# Patient Record
Sex: Male | Born: 1935 | Race: White | Hispanic: No | Marital: Married | Smoking: Never smoker
Health system: Southern US, Community
[De-identification: ages and names within clinical notes are randomized; demographics above are authoritative.]

## PROBLEM LIST (undated history)

## (undated) DIAGNOSIS — K219 Gastro-esophageal reflux disease without esophagitis: Secondary | ICD-10-CM

## (undated) DIAGNOSIS — I10 Essential (primary) hypertension: Secondary | ICD-10-CM

## (undated) DIAGNOSIS — N189 Chronic kidney disease, unspecified: Secondary | ICD-10-CM

## (undated) HISTORY — PX: COLONOSCOPY: SHX174

## (undated) HISTORY — PX: APPENDECTOMY: SHX54

## (undated) HISTORY — PX: COLON SURGERY: SHX602

## (undated) HISTORY — PX: KIDNEY TRANSPLANT: SHX239

## (undated) HISTORY — PX: TONSILLECTOMY: SUR1361

## (undated) HISTORY — PX: OTHER SURGICAL HISTORY: SHX169

---

## 2006-07-31 ENCOUNTER — Ambulatory Visit (HOSPITAL_COMMUNITY): Admission: RE | Admit: 2006-07-31 | Discharge: 2006-07-31 | Payer: Self-pay | Admitting: Nephrology

## 2006-08-01 ENCOUNTER — Ambulatory Visit (HOSPITAL_COMMUNITY): Admission: RE | Admit: 2006-08-01 | Discharge: 2006-08-01 | Payer: Self-pay | Admitting: Diagnostic Radiology

## 2006-09-04 ENCOUNTER — Ambulatory Visit: Payer: Self-pay | Admitting: Vascular Surgery

## 2006-09-14 ENCOUNTER — Ambulatory Visit: Payer: Self-pay | Admitting: Vascular Surgery

## 2006-09-14 ENCOUNTER — Ambulatory Visit (HOSPITAL_COMMUNITY): Admission: RE | Admit: 2006-09-14 | Discharge: 2006-09-14 | Payer: Self-pay | Admitting: Vascular Surgery

## 2006-09-30 ENCOUNTER — Ambulatory Visit: Payer: Self-pay | Admitting: Vascular Surgery

## 2006-11-23 ENCOUNTER — Ambulatory Visit (HOSPITAL_COMMUNITY): Admission: RE | Admit: 2006-11-23 | Discharge: 2006-11-23 | Payer: Self-pay | Admitting: Nephrology

## 2007-03-05 ENCOUNTER — Ambulatory Visit (HOSPITAL_COMMUNITY): Admission: RE | Admit: 2007-03-05 | Discharge: 2007-03-05 | Payer: Self-pay | Admitting: Nephrology

## 2007-05-07 ENCOUNTER — Ambulatory Visit (HOSPITAL_COMMUNITY): Admission: RE | Admit: 2007-05-07 | Discharge: 2007-05-07 | Payer: Self-pay | Admitting: Nephrology

## 2008-05-17 IMAGING — XA IR AV DIALYSIS ACCESS DECLOT
1 series · 12 of 24 positions shown · non-contrast
Comparison: none

08/06/06 – CORRECTED EXAM ORDERS:  The exams on accession numbers [id number] and [id number] were ordered in error, and a credit has been applied to the patient’s account for these exams.   A new charge has been issued and associated with this report for SP US GUIDE VASC ACCESS, accession # [id number].  No change has been made to the original report.
CLINICAL DATA: End stage renal failure with clotted left upper extremity fistula.
 LEFT UPPER EXTREMITY FISTULA DECLOT:
 LEFT UPPER EXTREMITY PTA:
 ULTRASOUND GUIDANCE FOR VASCULAR ACCESS:
 Medications:  Heparin 6333 units, TPA 4 mg.  The patient was continuously monitored by the radiology nurse, who administered IV Versed and fentanyl for moderate sedation. 
 Procedure:  Informed consent was obtained.  Ultrasound demonstrated large central thrombus throughout the entire extent of the left cephalic vein.  The AV fistula was patent, but the clot started just beyond the anastomosis.  The left upper extremity was prepped and draped in a sterile fashion.  The clotted vein was accessed using ultrasound guidance and a micropuncture dilator set was placed.  A 6-French vascular sheath was placed, and a wire and catheter were advanced into the central cephalic vein and eventually into the left subclavian vein.  The left subclavian vein and innominate vein were found to be patent.  An infusion catheter was placed along the length of the cephalic vein, and 4 mg of TPA was administered with pulse-spray technique.  Approximately 15 minutes after administration of the TPA, the entire cephalic vein was treated with the AngioJet thrombectomy device.  Ultrasound suggested there was very little change in the amount of thrombus.  The catheter was readvanced to the central cephalic vein, and a venogram demonstrated a critical stenosis at the junction of the cephalic vein and left subclavian vein.  This area was angioplastied multiple times with a 7-mm balloon demonstrating improved flow but a persistently small lumen.  The cephalic vein was retreated with the Angioject balloon, and the entire length of the cephalic vein was angioplastied with a 7-mm balloon.  A second access was obtained using ultrasound guidance and directed towards the AV fistula.  The clot near the fistula was treated with an over-the-wire Fogarty balloon.  Following the balloon thrombectomy, there was improved flow in the distal aspect of the cephalic vein, but there continued to be poor outflow.  This area was retreated with an angioplasty balloon but again, there was no significant improvement in the more central flow.  No further intervention was felt to be of benefit, and the sheaths were removed with manual compression.  Initially, there was a strong pulse the cephalic vein following removal of the sheath, but this diminished over time.

[Series 1: run · 12 of 29 slices shown]
[im 2/29]
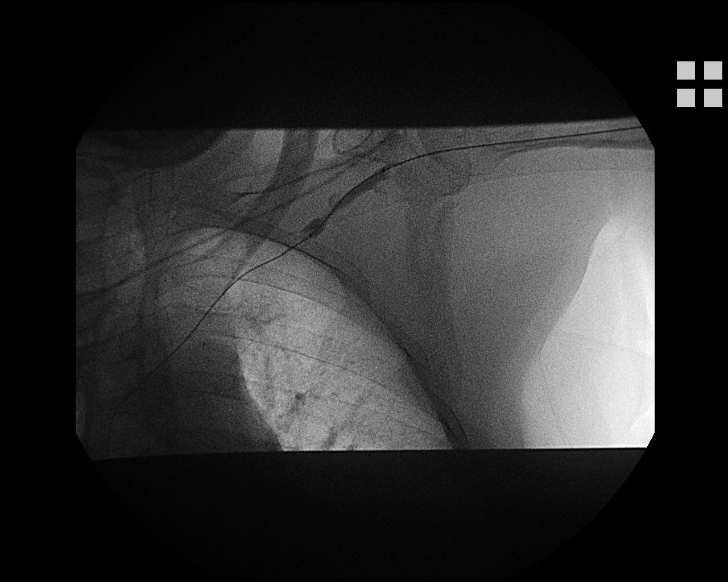
[im 4/29]
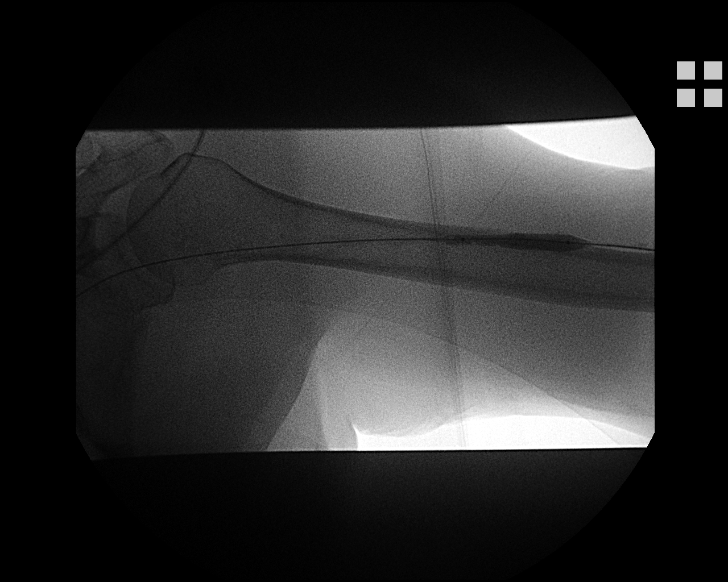
[im 7/29]
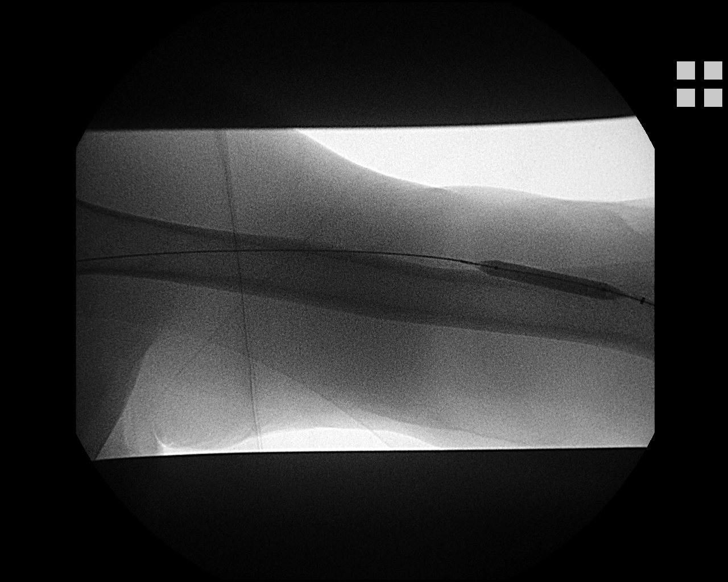
[im 9/29]
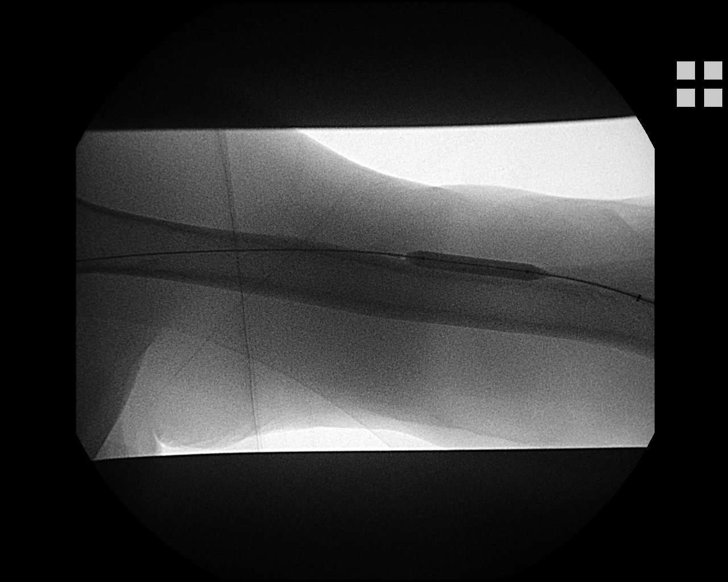
[im 11/29]
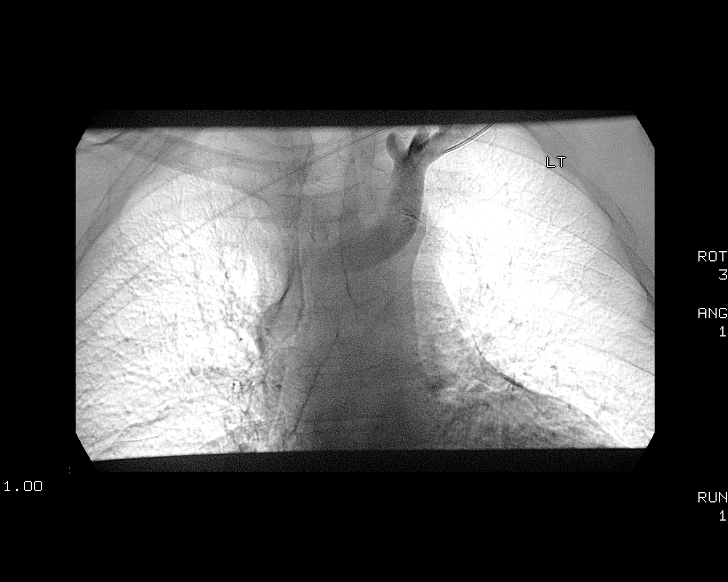
[im 14/29]
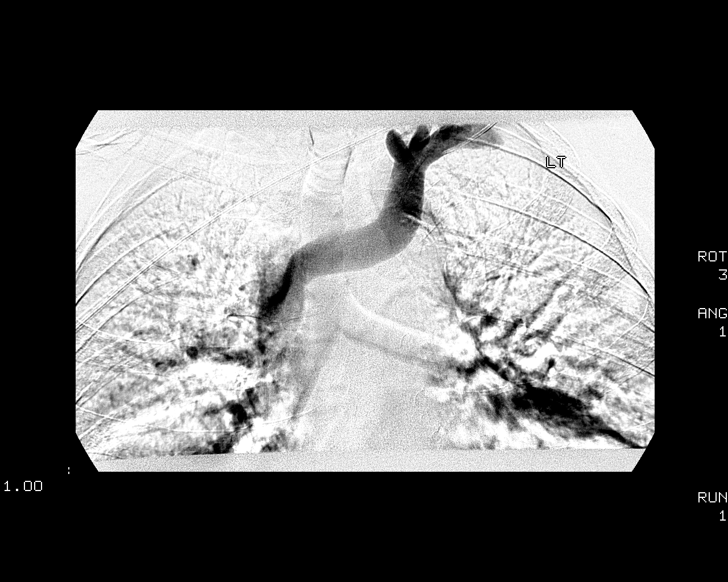
[im 16/29]
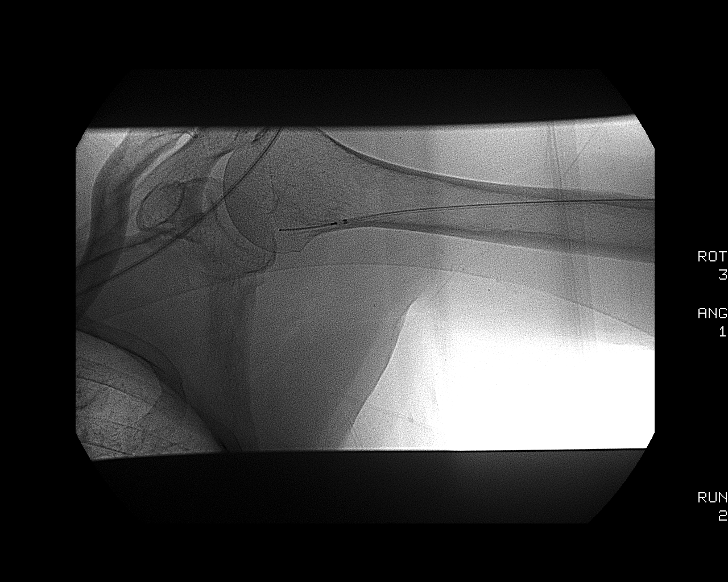
[im 19/29]
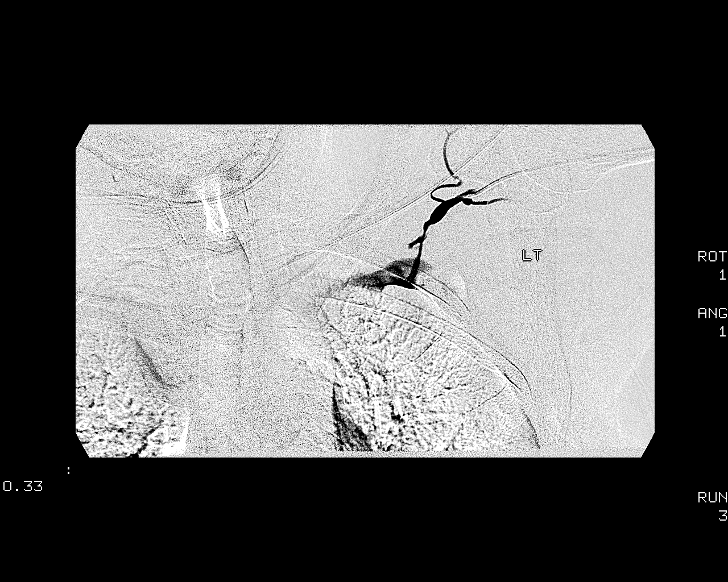
[im 21/29]
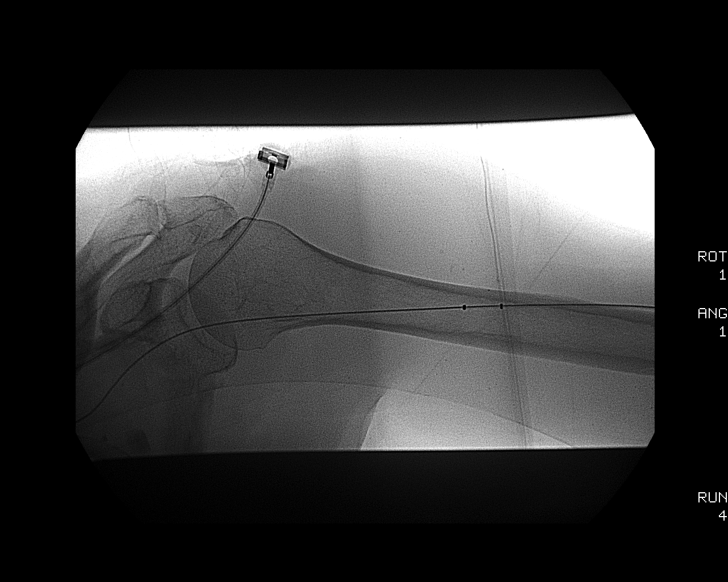
[im 24/29]
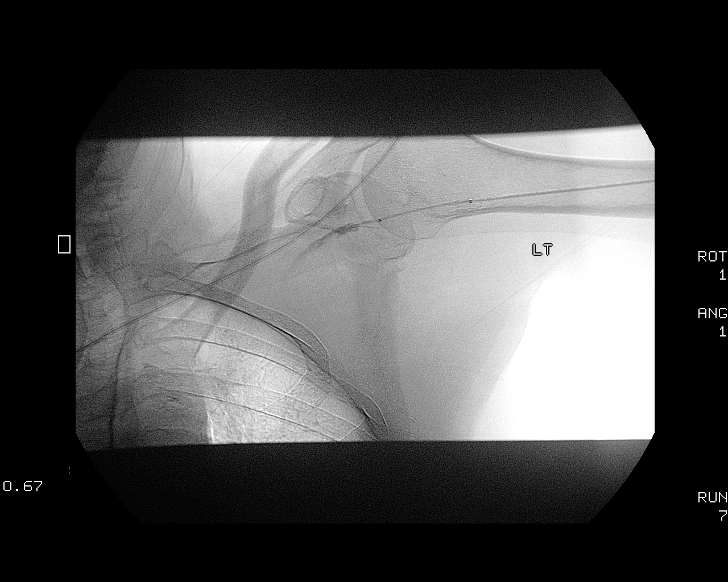
[im 26/29]
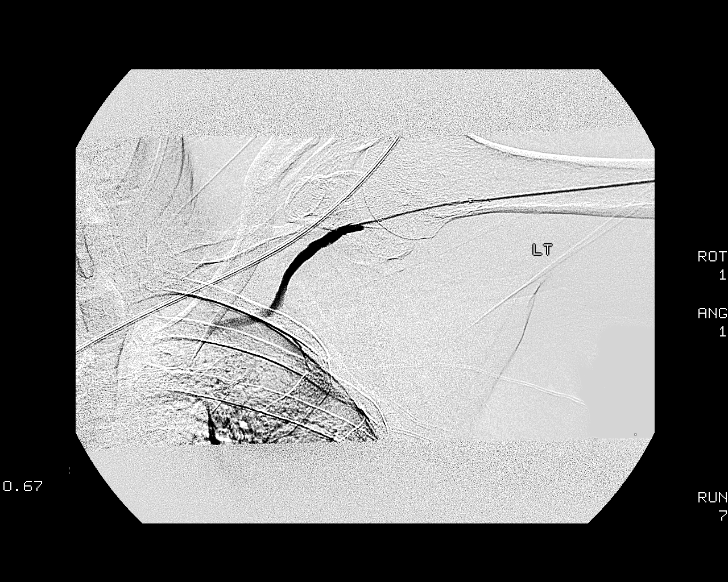
[im 29/29]
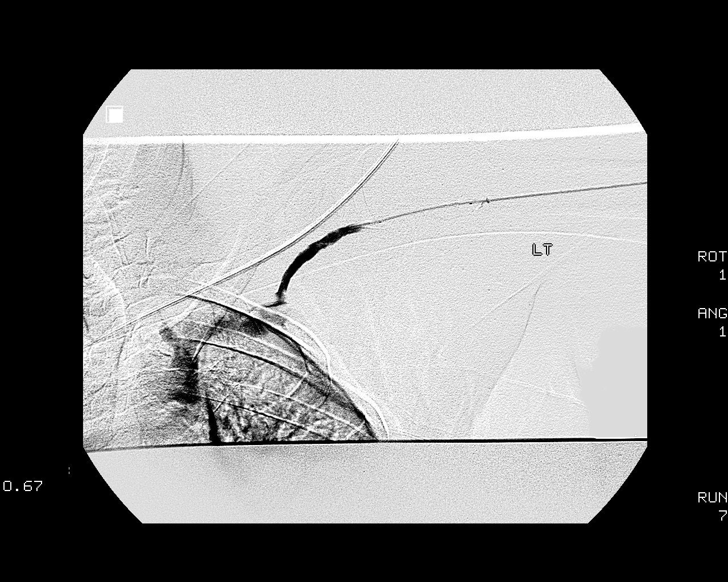

[12 of 24 positions shown; findings below may reference images not displayed]

IMPRESSION: Thrombosis of the left cephalic vein with a critical stenosis involving the central cephalic vein.  Unable to successfully declot the left cephalic vein.  These findings were discussed with Dr. Trimmel, and the patient will be set up for a tunneled dialysis catheter in the morning prior to hemodialysis.

## 2008-07-01 IMAGING — CR DG CHEST 1V
1 series · 1 of 1 positions shown · non-contrast
Comparison: Earlier study.

CLINICAL DATA: End-stage renal disease. Question nipple shadow versus nodule.
 PA CHEST WITH NIPPLE MARKERS ? 1 VIEW:

[view not recorded]
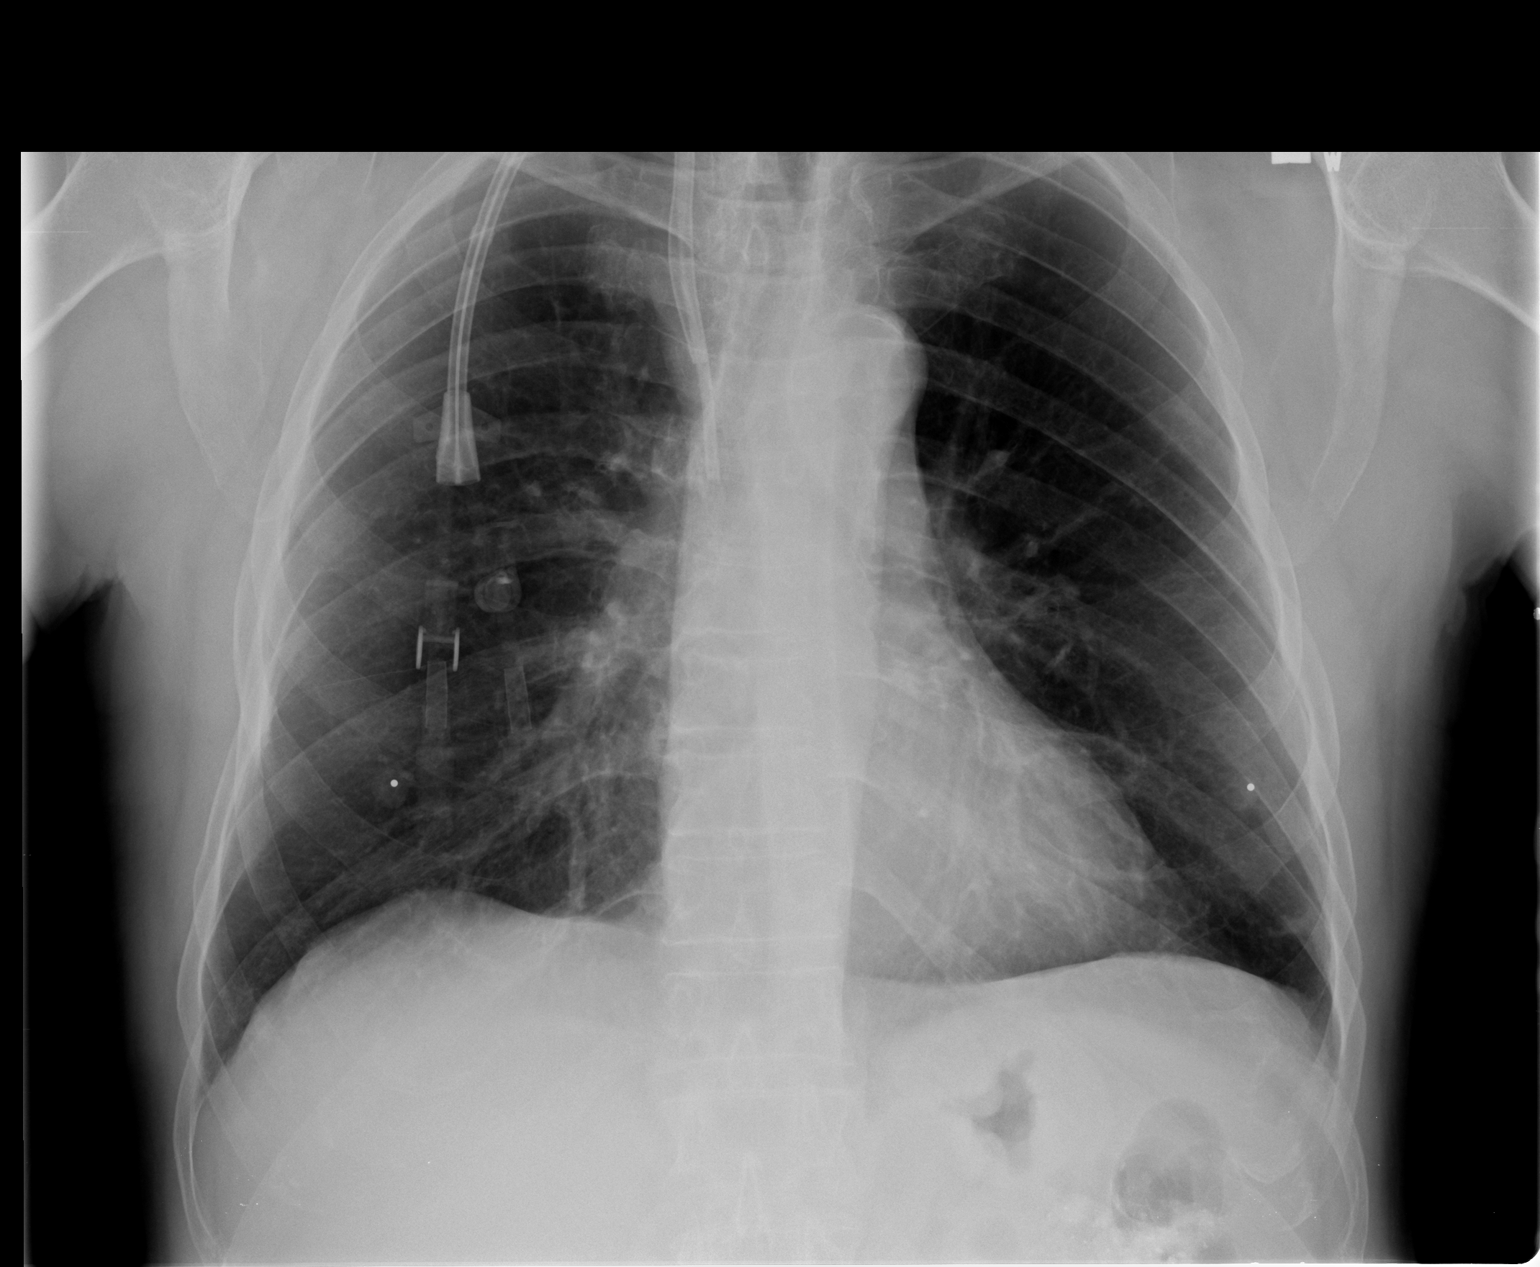

[1 of 1 positions shown; findings below may reference images not displayed]

FINDINGS: The questioned nodular densities associated with the lung bases do represent the patient?s nipples. There is no evidence for a parenchymal pulmonary nodule.
IMPRESSION: Nodular density noted on the recent chest film corresponds to the patient?s nipples.

## 2008-07-01 IMAGING — CR DG CHEST 2V
2 series · 2 of 2 positions shown · non-contrast
Comparison: No priors.

CLINICAL DATA: Endstage renal disease.  Preoperative chest.
 CHEST ? 2 VIEW:

[view not recorded (1 of 2)]
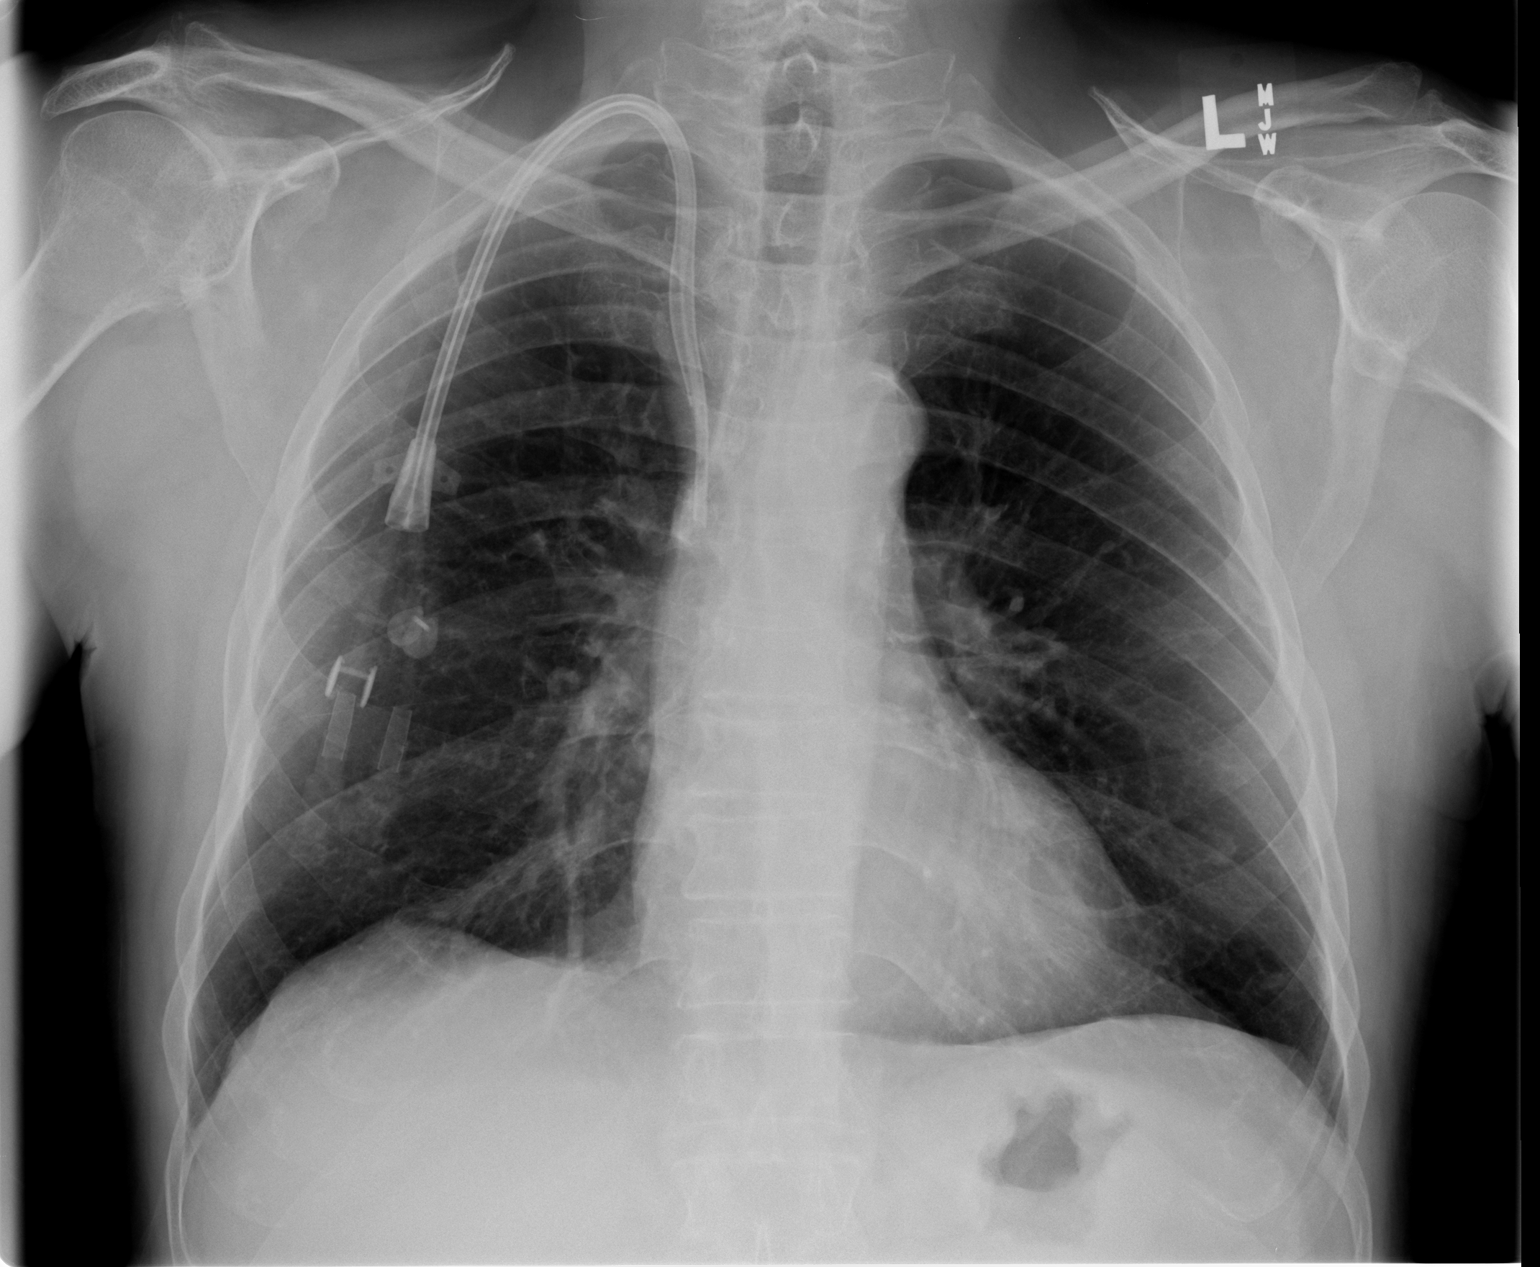

[view not recorded (2 of 2)]
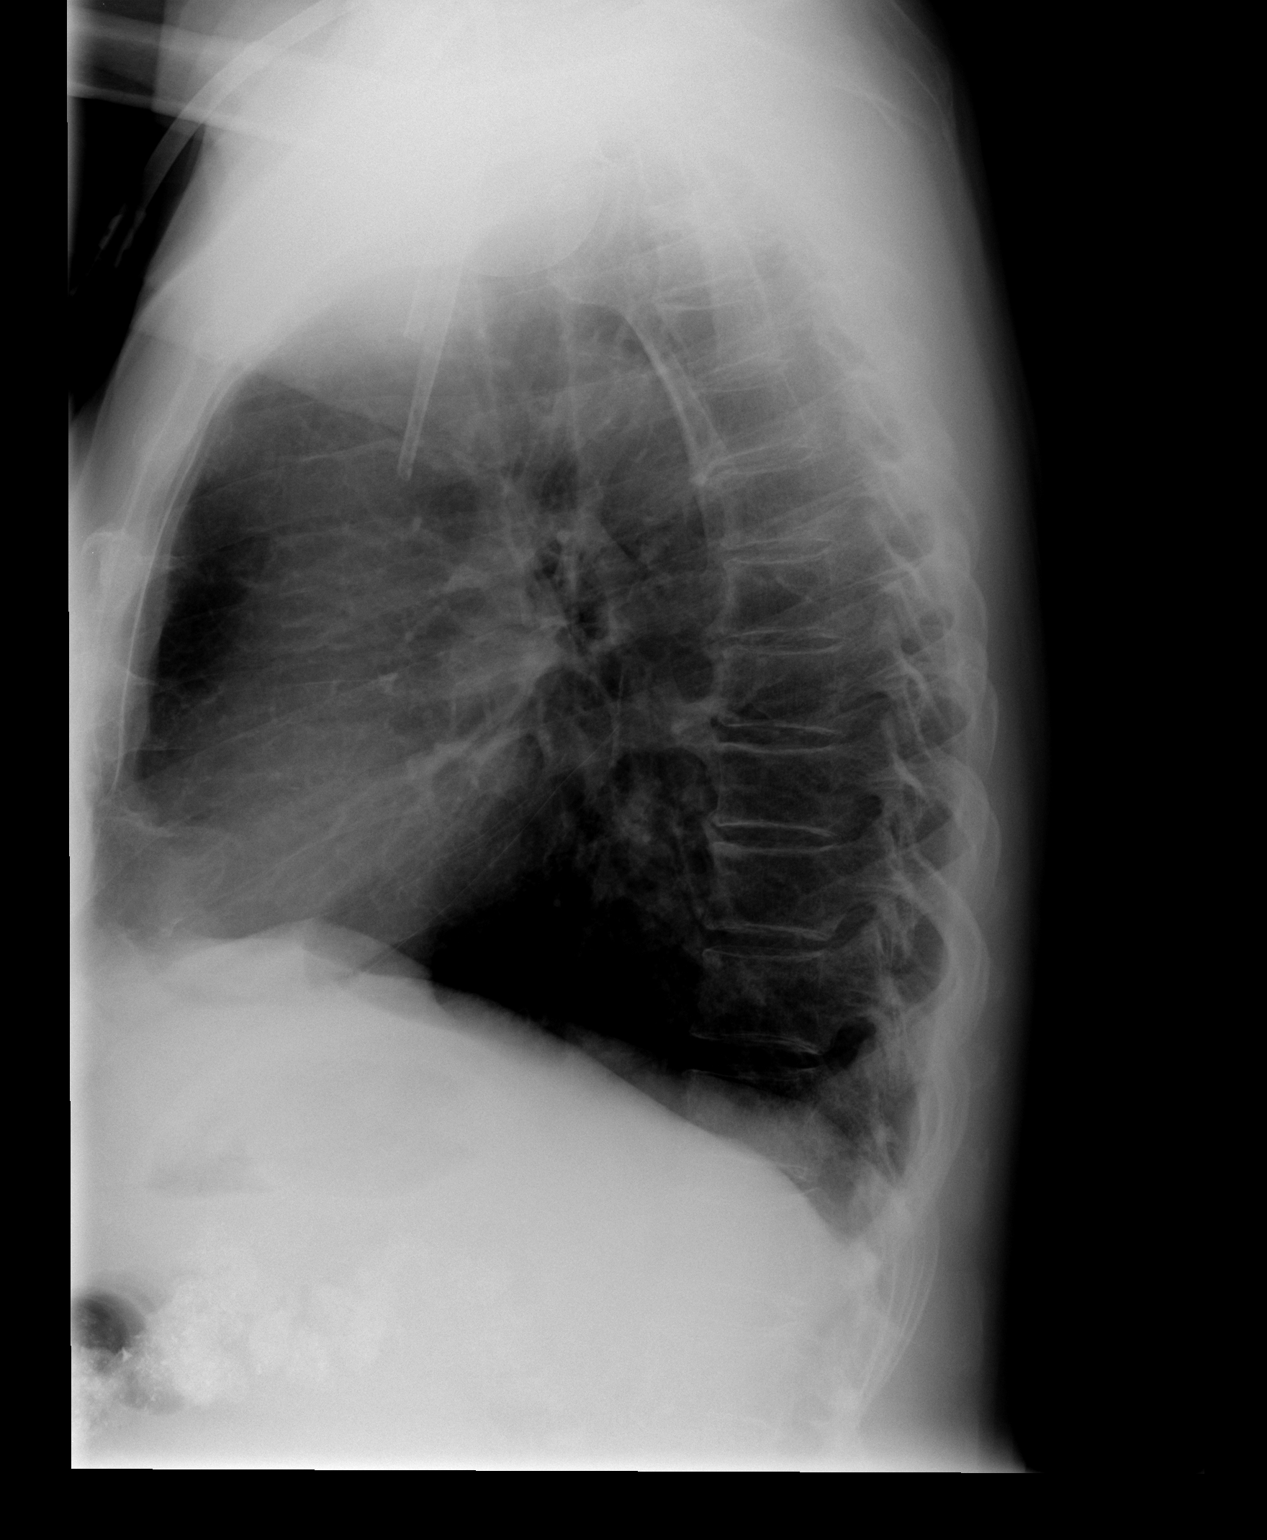

[2 of 2 positions shown; findings below may reference images not displayed]

FINDINGS: There is a nodular density associated with the left lung base, which may represent a pulmonary nodule or nipple shadow, and I recommend PA chest with nipple markers.  There is a hemodialysis central venous catheter seen via the right internal jugular venous route with the tips of the catheter within the superior vena cava.  There are no infiltrative or edematous changes.  The heart and mediastinal structures are normal.
IMPRESSION: 1. Left basilar nodule versus nipple shadow.  Recommend PA chest with nipple markers.  
 2. No acute infiltrative or edematous changes.

## 2008-08-30 ENCOUNTER — Ambulatory Visit (HOSPITAL_COMMUNITY): Admission: RE | Admit: 2008-08-30 | Discharge: 2008-08-30 | Payer: Self-pay | Admitting: Vascular Surgery

## 2008-09-04 ENCOUNTER — Ambulatory Visit: Payer: Self-pay | Admitting: Surgery

## 2008-09-08 ENCOUNTER — Ambulatory Visit (HOSPITAL_COMMUNITY): Admission: RE | Admit: 2008-09-08 | Discharge: 2008-09-08 | Payer: Self-pay | Admitting: Surgery

## 2008-09-08 ENCOUNTER — Ambulatory Visit: Payer: Self-pay | Admitting: Vascular Surgery

## 2008-09-22 ENCOUNTER — Ambulatory Visit (HOSPITAL_COMMUNITY): Admission: RE | Admit: 2008-09-22 | Discharge: 2008-09-22 | Payer: Self-pay | Admitting: Vascular Surgery

## 2008-12-11 ENCOUNTER — Ambulatory Visit: Payer: Self-pay | Admitting: Surgery

## 2010-05-11 LAB — POCT I-STAT, CHEM 8
Chloride: 106 mEq/L (ref 96–112)
HCT: 33 % — ABNORMAL LOW (ref 39.0–52.0)
Hemoglobin: 11.2 g/dL — ABNORMAL LOW (ref 13.0–17.0)

## 2010-05-11 LAB — POCT I-STAT 4, (NA,K, GLUC, HGB,HCT)
Glucose, Bld: 101 mg/dL — ABNORMAL HIGH (ref 70–99)
Hemoglobin: 12.2 g/dL — ABNORMAL LOW (ref 13.0–17.0)
Potassium: 4.6 mEq/L (ref 3.5–5.1)
Sodium: 138 mEq/L (ref 135–145)

## 2010-06-18 NOTE — Op Note (Signed)
Ricky Wallace, Ricky Wallace              ACCOUNT NO.:  [id number]   MEDICAL RECORD NO.:  [id number]          PATIENT TYPE:  AMB   LOCATION:  SDS                          FACILITY:  [facility]   PHYSICIAN:  Quita Skye. Hart Rochester, M.D.  DATE OF BIRTH:  [birth year]   DATE OF PROCEDURE:  09/22/2008  DATE OF DISCHARGE:  09/22/2008                               OPERATIVE REPORT   PREOPERATIVE DIAGNOSIS:  End-stage renal disease.   POSTOPERATIVE DIAGNOSIS:  End-stage renal disease.   OPERATION:  Insertion of left upper arm brachial artery to axillary vein  arteriovenous Gore-Tex graft (6-mm).   SURGEON:  Quita Skye. Hart Rochester, MD   FIRST ASSISTANT:  Nurse.   ANESTHESIA:  Local.   PROCEDURE:  The patient was taken to the operating room, placed in the  supine position, at which time, left upper extremity was prepped with  Betadine scrub and solution and draped in a routine sterile manner.  After infiltration of 1% Xylocaine with epinephrine, a short  longitudinal incision was made in the distal upper arm overlying the  brachial artery.  There had been a previous fistula, which had failed  and the anastomosis of cephalic vein and brachial artery dissected free  proximally and distally.  Artery had an excellent pulse.  A second  incision was made just distal to the axilla.  Axillary vein was  dissected free at its junction with the brachial vein, it was a 5-mm  vein of good quality.  A curvilinear tunnel was created on the anterior  aspect of the arm after infiltration of 1% Xylocaine and a 6-mm x 20-cm  Gore-Tex graft was delivered through the tunnel.  No heparin was given.  Brachial arteries were occluded proximally and distally with vessel  loops with a 15-blade where the previous cephalic vein anastomosis had  been performed, this was completely excised.  There was excellent flow  at this site.  The graft was anastomosed end-to-side with 6-0 Prolene  and then anastomosed end-to-end to axillary vein with 6-0  Prolene after  transecting the vein and ligating it distally.  Clamps were then  released and there was an excellent pulse and thrill in the graft and  good Doppler flow on the radial artery distally, although it did augment  with occlusion of the graft.  Wounds were closed in layers with Vicryl  in subcuticular fashion.  Sterile dressing was applied.  The patient was  taken to recovery room in satisfactory condition.      Quita Skye Hart Rochester, M.D.  Electronically Signed     JDL/MEDQ  D:  09/22/2008  T:  09/23/2008  Job:  [id number]

## 2010-06-18 NOTE — Op Note (Signed)
Ricky Wallace, Ricky Wallace              ACCOUNT NO.:  [id number]   MEDICAL RECORD NO.:  [id number]          PATIENT TYPE:  AMB   LOCATION:  SDS                          FACILITY:  [facility]   PHYSICIAN:  Janetta Hora. Fields, MD  DATE OF BIRTH:  [birth year]   DATE OF PROCEDURE:  09/14/2006  DATE OF DISCHARGE:  09/14/2006                               OPERATIVE REPORT   PROCEDURE:  Right brachiocephalic AV fistula.   PREOPERATIVE DIAGNOSIS:  End-stage renal disease.   POSTOPERATIVE DIAGNOSIS:  End-stage renal disease.   ANESTHESIA:  Local with IV sedation.   ASSISTANT:  Emilio Aspen, RNFA.   FINDINGS:  A 3 mm cephalic vein.   OPERATIVE DETAILS:  After obtaining informed consent, the patient taken  to the operating room.  The patient placed supine position on the  operating room table.  After adequate sedation, the patient's entire  right upper extremity was  prepped and draped in the usual sterile  fashion.  Local anesthesia was then infiltrated near the antecubital  crease.  A transverse incision was made in this location, carried down  through the subcutaneous tissues down to the level of the cephalic vein.  Cephalic vein was dissected free circumferentially.  Small side branches  were ligated and divided between silk ties.  The median antecubital vein  joining the basilic to cephalic system was divided.  Brachial artery was  then dissected free in the medial portion of the incision.  Small side  branches were controlled with Vesseloops.  The patient was given 5000  units of intravenous heparin.  Brachial artery was controlled proximally  and distally with Vesseloops.  Distal cephalic vein was divided and  ligated with a 3-0 silk tie.  Vein was then swung over to the level of  the artery so that the basilic vein system was posterior to the fistula.  Longitudinal arteriotomy was made in the brachial artery and the vein  was sewn end of vein to side of artery using a running 7-0 Prolene  suture.  Just prior to completion of the anastomosis, this was forebled,  backbled and thoroughly flushed.  Anastomosis was secured.  Vesseloops  were released.  There was palpable thrill in the fistula immediately.  Hemostasis was obtained.  Subcutaneous tissues were reapproximated using  running 3-0 Vicryl suture.  Skin was closed with a 4-0 Vicryl  subcuticular stitch.  The patient tolerate procedure well and there were  no complications.  Instrument, sponge and needle count was correct at  the end of the case.  The patient was taken to the recovery room in  stable condition.      Janetta Hora. Fields, MD  Electronically Signed     CEF/MEDQ  D:  09/14/2006  T:  09/15/2006  Job:  [id number]

## 2010-06-18 NOTE — Assessment & Plan Note (Signed)
OFFICE VISIT   GRAYLING, SCHRANZ  DOB:  Oct 04, 1922                                       09/04/2006  AVWUJ#:[id number]   NOTE FOR THE CHART:  The patient is an 77 year old male with end-stage renal disease.  He  currently dialyzes on Tuesday, Thursday and Saturday.  He was started on  hemodialysis in January of this year.  He previously had a left  brachiocephalic AV fistula placed but this has now failed.  He presents  today for consideration for placement of a new access.   PAST MEDICAL HISTORY:  Is remarkable for hypertension, otherwise it is  fairly unremarkable.   MEDICATIONS:  Colchicine 0.6 mg once a day p.r.n.  Fosrenol 1500 mg with meals, 500 mg with snacks.  Kayexalate 30 mg p.r.n.  Nephrovite 1 everyday.  Protonix 40 mg once a day.  Folic acid 1 mg daily.   PAST SURGICAL HISTORY:  Remarkable for his fistula placement, otherwise  no other significant operations.   SOCIAL HISTORY:  He is married and has 2 children.  He is a nonsmoker  and non-consumer of alcohol.   REVIEW OF SYSTEMS:  NEUROLOGICAL:  Has some occasional dizziness when  standing suddenly.  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  Otherwise it is fairly unremarkable.   ALLERGIES:  No known drug allergies.   PHYSICAL EXAMINATION:  Vital signs:  Blood pressure 151/88 in the right  arm, heart rate is 82 and regular.  He has 2+ brachial and radial pulses  bilaterally.  He has a palpable cord and grossly occluded left  brachiocephalic AV fistula.   On placement of the tourniquet on the right upper arm, he has an easily  palpable right upper arm cephalic vein.  The cephalic vein is small at  the wrist.  Overall I believe the best option for the this patient for  his next access would be a right brachiocephalic AV fistula.  He is  currently dialyzing by right-sided tunnel catheter.  He will continue to  use this until the fistula is ready for use.  He was  scheduled to have  his fistula placed on 09/14/2006.  Procedure details, risks, benefits,  possible complications were explained to the patient, he agrees to  proceed.   Janetta Hora. Fields, MD  Electronically Signed   CEF/MEDQ  D:  09/06/2006  T:  09/07/2006  Job:  [id number]   cc:   Jorja Loa, M.D.

## 2010-06-18 NOTE — Op Note (Signed)
Ricky Wallace, Ricky Wallace              ACCOUNT NO.:  [id number]   MEDICAL RECORD NO.:  [id number]          PATIENT TYPE:  AMB   LOCATION:  SDS                          FACILITY:  [facility]   PHYSICIAN:  Quita Skye. Hart Rochester, M.D.  DATE OF BIRTH:  17-Oct-1922   DATE OF PROCEDURE:  09/08/2008  DATE OF DISCHARGE:  09/08/2008                               OPERATIVE REPORT   PREOPERATIVE DIAGNOSIS:  End-stage renal disease.   POSTOPERATIVE DIAGNOSIS:  End-stage renal disease.   PROCEDURES:  Bilateral upper extremity venogram and central venograms.   SURGEON:  Quita Skye. Hart Rochester, M.D.   ANESTHESIA:  None.   CONTRAST:  50 mL Visipaque.   DESCRIPTION OF PROCEDURE:  The patient was taken to the peripheral  vascular lab and the patient positioned on the table.  Bilateral upper  extremity IVs had been placed.  Bilateral upper extremity venograms were  performed beginning in the proximal forearm extending up throughout the  upper arm into the chest.  Beginning on the left side this was done  using a 50% contrast saline concentration of Visipaque.  On the left  side the brachial and basilic veins in the antecubital area and  throughout the upper arm were quite small but did converge to form a  normal sized axillary vein which was widely patent in the subclavian and  innominate veins into the chest.  On the right side there were also  small brachial and basilic veins with the axillary vein being adequate  size in the axilla.  However, there was severe stenosis in the stent  which had been placed in the innominate vein in the past with very tight  narrowing approximating 90-95% within the stent.  Having tolerated the  procedure well the patient was taken to short stay and discharged to be  scheduled for an AV graft in the left arm in the near future.      Quita Skye Hart Rochester, M.D.  Electronically Signed     JDL/MEDQ  D:  09/11/2008  T:  09/11/2008  Job:  [id number]

## 2010-06-18 NOTE — Assessment & Plan Note (Signed)
OFFICE VISIT   Ricky Wallace, Ricky Wallace  DOB:  March 27, 1922                                       09/04/2008  ZOXWR#:[id number]   REASON FOR VISIT:  New access.   HISTORY:  This is an 77 year old gentleman with end-stage renal disease  who dialyzes on Tuesday, Thursday, Saturday.  He has had a fistula in  his left upper arm as well as his right upper arm, the one in the right  upper arm has recently thrombosed and could not be opened  percutaneously.  He does have a right innominate vein stent.  He had a  catheter placed at [facility] in his left groin recently, he comes in today  for new access planning.   I would like to proceed with bilateral upper extremity venogram and  central venogram to evaluate for central stenosis.  He is right-handed,  so if his central veins are patent I would like to proceed with a left  forearm versus upper arm AV Gore-Tex graft.  He does report having a dye  allergy, however he appears to have had multiple dye studies since his  allergy which was documented several years ago.  However, I will  premedicate him prior to his procedure.  Again, I would like to get a  bilateral upper extremity and central venogram, will schedule this for  Wednesday and then get him set up for his graft.   Jorge Ny, MD  Electronically Signed   VWB/MEDQ  D:  09/04/2008  T:  09/05/2008  Job:  [id number]   cc:   Jorja Loa, M.D.

## 2010-06-18 NOTE — Assessment & Plan Note (Signed)
OFFICE VISIT   LAITH, ANTONELLI  DOB:  1922-04-06                                       12/11/2008  ZOXWR#:[id number]   REASON FOR VISIT:  Evaluate dialysis graft.   HISTORY:  This is an 77 year old gentleman who had a left upper arm  graft placed several months ago.  He was recently at [facility]  for fevers.  There was concern that the graft may be the source.  However, at that time he was recently postop and had some bruising,  although the graft had been able to be accessed.  He comes in today and  the graft is without any evidence of erythema or infection.  It is  nontender.  There is a good thrill within the graft.  I do not think  there is anything wrong with this graft.  He will follow up with [place] on a  p.r.n. basis.   Jorge Ny, MD  Electronically Signed   VWB/MEDQ  D:  12/11/2008  T:  12/12/2008  Job:  [id number]

## 2010-06-18 NOTE — Assessment & Plan Note (Signed)
OFFICE VISIT   SOLLIE, VULTAGGIO  DOB:  Oct 13, 1922                                       09/30/2006  EAVWU#:[id number]   The patient returns for followup today after placement of a right  brachiocephalic AV fistula on August 11.   PHYSICAL EXAMINATION:  His incision is well healed.  He has an easily  palpable thrill in the fistula.  It is greater than 20 mm in diameter in  the upper arm.  He has no evidence of steal.  The fistula should be  ready to use within 1 month's time.  He will follow up on an as-needed  basis.   Janetta Hora. Fields, MD  Electronically Signed   CEF/MEDQ  D:  09/30/2006  T:  10/01/2006  Job:  [id number]

## 2010-06-18 NOTE — Op Note (Signed)
[name], [name]              ACCOUNT NO.:  [id number]   MEDICAL RECORD NO.:  [id number]          PATIENT TYPE:  AMB   LOCATION:  SDS                          FACILITY:  [facility]   PHYSICIAN:  Quita Skye. Hart Rochester, M.D.  DATE OF BIRTH:  [birth year]   DATE OF PROCEDURE:  DATE OF DISCHARGE:  09/08/2008                               OPERATIVE REPORT   PREOPERATIVE DIAGNOSIS:  End-stage renal disease.   POSTOPERATIVE DIAGNOSIS:  End-stage renal disease.   PROCEDURES:  Bilateral upper extremity venogram and central venograms.   SURGEON:  Quita Skye. Hart Rochester, M.D.   ANESTHESIA:  None.   CONTRAST:  50 mL Visipaque.   DESCRIPTION OF PROCEDURE:  The patient was taken to the peripheral  vascular lab and the patient positioned on the table.  Bilateral upper  extremity IVs had been placed.  Bilateral upper extremity venograms were  performed beginning in the proximal forearm extending up throughout the  upper arm into the chest.  Beginning on the left side this was done  using a 50% contrast saline concentration of Visipaque.  On the left  side the brachial and basilic veins in the antecubital area and  throughout the upper arm were quite small but did converge to form a  normal sized axillary vein which was widely patent in the subclavian and  innominate veins into the chest.  On the right side there were also  small brachial and basilic veins with the axillary vein being adequate  size in the axilla.  However, there was severe stenosis in the stent  which had been placed in the innominate vein in the past with very tight  narrowing approximating 90-95% within the stent.  Having tolerated the  procedure well the patient was taken to short stay and discharged to be  scheduled for an AV graft in the left arm in the near future.      Quita Skye Hart Rochester, M.D.  Electronically Signed     JDL/MEDQ  D:  09/08/2008  T:  09/13/2008  Job:  [id number]

## 2010-07-10 IMAGING — CR DG CHEST 2V
2 series · 2 of 2 positions shown · non-contrast
Comparison: Two-view chest x-ray 09/14/2006.

CLINICAL DATA: End-stage renal disease.  Dialysis access surgery.
Preoperative evaluation.

CHEST - 2 VIEW 09/22/2008:

[view not recorded (1 of 2)]
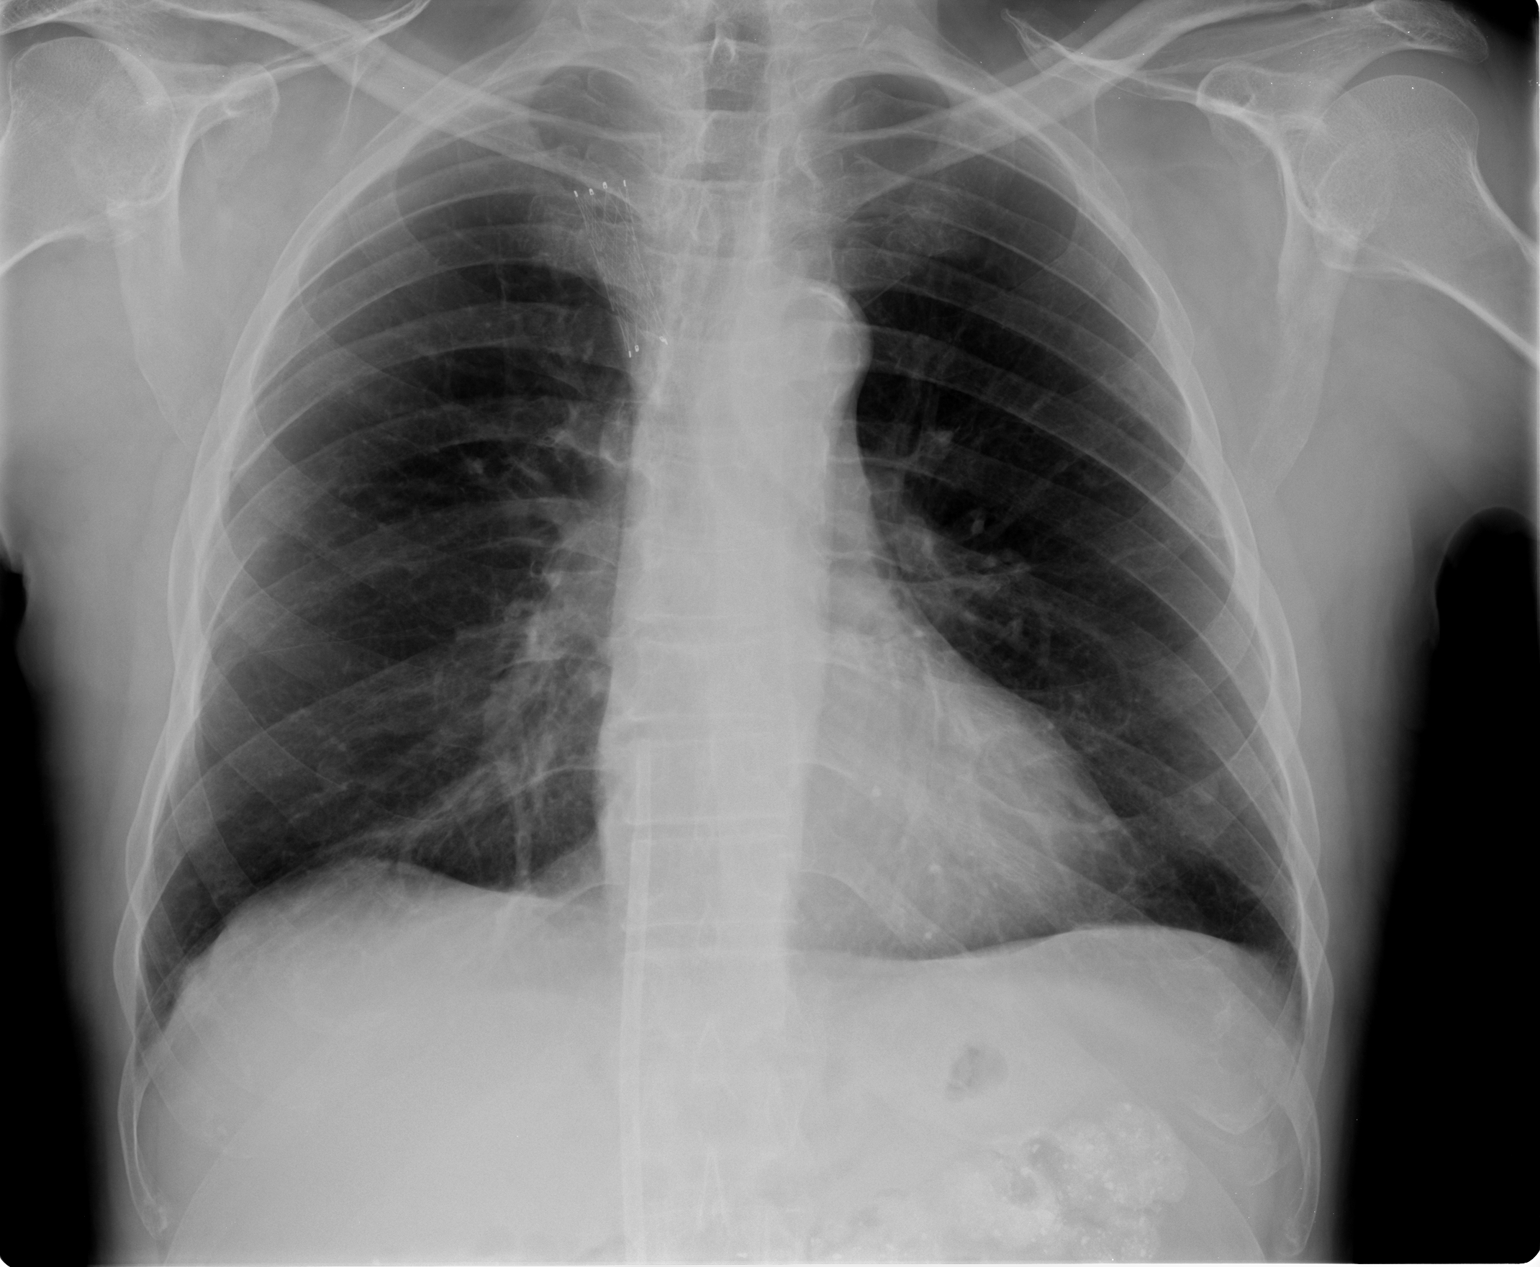

[view not recorded (2 of 2)]
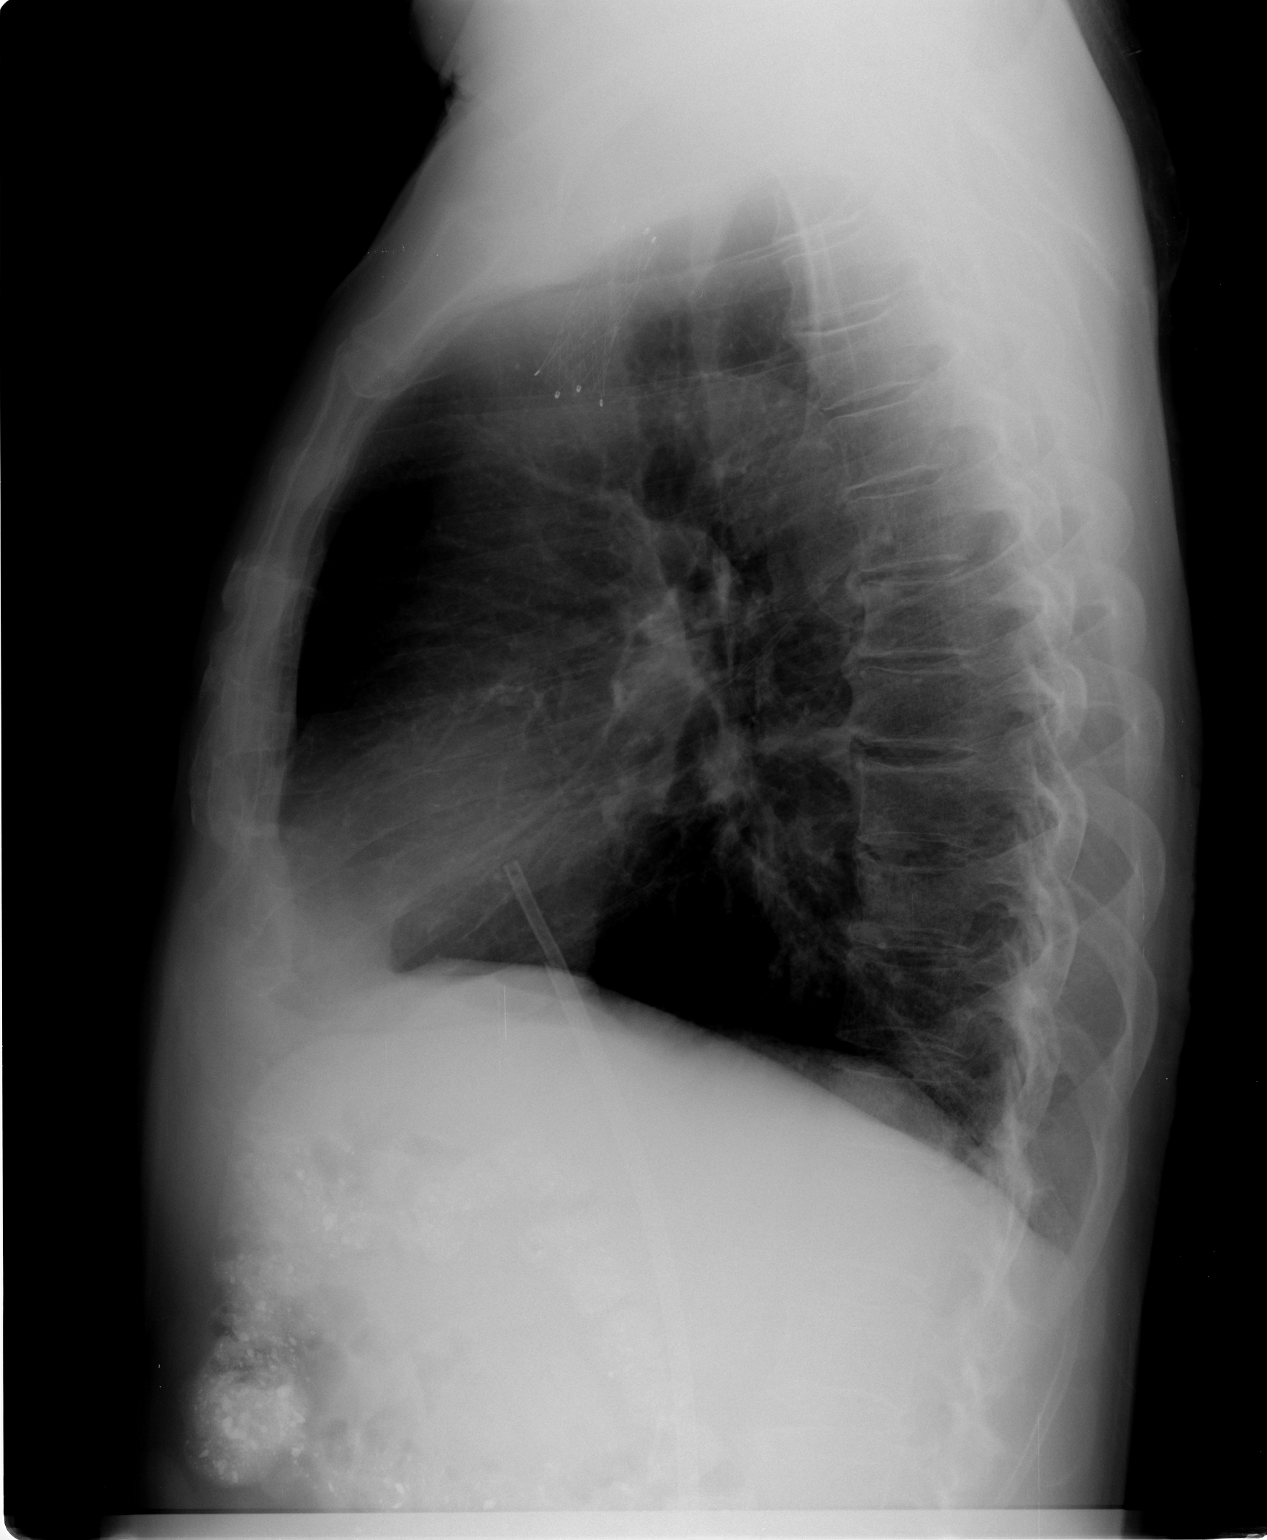

[2 of 2 positions shown; findings below may reference images not displayed]

FINDINGS: Heart size normal and stable.  Thoracic aorta
atherosclerotic, unchanged.  Hilar and mediastinal contours
otherwise unremarkable. Lungs clear.  Pulmonary vascularity normal.
No pleural effusions.  Apparent nodular opacity projected over the
left lung base shown on the prior study to represent the nipple.
Right subclavian vein stent.  Right femoral dialysis catheter tips
in the right atrium.  Degenerative changes in the mid thoracic
spine.
IMPRESSION: No acute cardiopulmonary disease.

## 2010-11-18 LAB — POCT I-STAT 4, (NA,K, GLUC, HGB,HCT)
Glucose, Bld: 97
HCT: 38 — ABNORMAL LOW
Hemoglobin: 12.9 — ABNORMAL LOW
Operator id: 274481
Potassium: 5.1

## 2010-11-20 LAB — POTASSIUM: Potassium: 5.4 — ABNORMAL HIGH

## 2011-04-02 ENCOUNTER — Encounter (INDEPENDENT_AMBULATORY_CARE_PROVIDER_SITE_OTHER): Payer: Self-pay | Admitting: *Deleted

## 2011-05-22 ENCOUNTER — Telehealth (INDEPENDENT_AMBULATORY_CARE_PROVIDER_SITE_OTHER): Payer: Self-pay | Admitting: *Deleted

## 2011-05-22 ENCOUNTER — Encounter (INDEPENDENT_AMBULATORY_CARE_PROVIDER_SITE_OTHER): Payer: Self-pay | Admitting: *Deleted

## 2011-05-22 ENCOUNTER — Other Ambulatory Visit (INDEPENDENT_AMBULATORY_CARE_PROVIDER_SITE_OTHER): Payer: Self-pay | Admitting: *Deleted

## 2011-05-22 DIAGNOSIS — Z85038 Personal history of other malignant neoplasm of large intestine: Secondary | ICD-10-CM

## 2011-05-22 MED ORDER — PEG-KCL-NACL-NASULF-NA ASC-C 100 G PO SOLR: 1.0000 | Freq: Once | ORAL | Status: DC

## 2011-05-22 NOTE — Telephone Encounter (Signed)
Patient needs movi prep 

## 2011-07-01 ENCOUNTER — Telehealth (INDEPENDENT_AMBULATORY_CARE_PROVIDER_SITE_OTHER): Payer: Self-pay | Admitting: *Deleted

## 2011-07-01 NOTE — Telephone Encounter (Signed)
PCP/Requesting MD: Knox Saliva @ [facility]  Name & DOB: Ricky Wallace 07/29/1922     Procedure: tcs  Reason/Indication:  Hx colon ca (1985) Had kidney transplant 2 yrs ago @ [facility]  Has patient had this procedure before?  yes  If so, when, by whom and where?  2 1/2 to 3 yrs ago (Dr Cleotis Nipper)  Is there a family history of colon cancer?  Not sure other than personal history  Who?  What age when diagnosed?    Is patient diabetic?   no      Does patient have prosthetic heart valve?  no  Do you have a pacemaker?  no  Has patient had joint replacement within last 12 months?  no  Is patient on Coumadin, Plavix and/or Aspirin? yes  Medications: asa 81 mg daily, amlodipine 10 mg daily, allegra 180 mg prn, folic acid 1 mg daily, metoprolol 12.5 mg daily, multi vit, myfortic 180 mg bid, omeprazole 20 mg daily, sulfamethoxazole-trimethoprim 400/80 mg 1 tab mon, wed & fri, prograf 1 mg bid, tamsulosin 0.4 mg daily,  Allergies: ivp dye  Medication Adjustment: asa 2 days  Procedure date & time: 07/31/11 at 830

## 2011-07-03 NOTE — Telephone Encounter (Signed)
agree

## 2011-07-22 ENCOUNTER — Encounter (HOSPITAL_COMMUNITY): Payer: Self-pay | Admitting: Pharmacy Technician

## 2011-07-31 ENCOUNTER — Ambulatory Visit (HOSPITAL_COMMUNITY)
Admission: RE | Admit: 2011-07-31 | Discharge: 2011-07-31 | Disposition: A | Discharge status: Home / Self Care | Payer: Medicare Other | Source: Ambulatory Visit | Attending: Internal Medicine | Admitting: Internal Medicine

## 2011-07-31 ENCOUNTER — Encounter (HOSPITAL_COMMUNITY): Payer: Self-pay | Admitting: *Deleted

## 2011-07-31 ENCOUNTER — Encounter (HOSPITAL_COMMUNITY)
Admission: RE | Disposition: A | Discharge status: Home / Self Care | Payer: Self-pay | Source: Ambulatory Visit | Attending: Internal Medicine

## 2011-07-31 DIAGNOSIS — Z85038 Personal history of other malignant neoplasm of large intestine: Secondary | ICD-10-CM | POA: Insufficient documentation

## 2011-07-31 DIAGNOSIS — Z7982 Long term (current) use of aspirin: Secondary | ICD-10-CM | POA: Insufficient documentation

## 2011-07-31 DIAGNOSIS — Z9049 Acquired absence of other specified parts of digestive tract: Secondary | ICD-10-CM | POA: Insufficient documentation

## 2011-07-31 DIAGNOSIS — Z79899 Other long term (current) drug therapy: Secondary | ICD-10-CM | POA: Insufficient documentation

## 2011-07-31 DIAGNOSIS — I10 Essential (primary) hypertension: Secondary | ICD-10-CM | POA: Insufficient documentation

## 2011-07-31 DIAGNOSIS — K5730 Diverticulosis of large intestine without perforation or abscess without bleeding: Secondary | ICD-10-CM | POA: Insufficient documentation

## 2011-07-31 DIAGNOSIS — D126 Benign neoplasm of colon, unspecified: Secondary | ICD-10-CM | POA: Insufficient documentation

## 2011-07-31 HISTORY — DX: Chronic kidney disease, unspecified: N18.9

## 2011-07-31 HISTORY — PX: COLONOSCOPY: SHX5424

## 2011-07-31 HISTORY — DX: Gastro-esophageal reflux disease without esophagitis: K21.9

## 2011-07-31 HISTORY — DX: Essential (primary) hypertension: I10

## 2011-07-31 SURGERY — COLONOSCOPY
Anesthesia: Moderate Sedation

## 2011-07-31 MED ORDER — MIDAZOLAM HCL 5 MG/5ML IJ SOLN
INTRAMUSCULAR | Status: AC
  Filled 2011-07-31: qty 10

## 2011-07-31 MED ORDER — SODIUM CHLORIDE 0.45 % IV SOLN
Freq: Once | INTRAVENOUS | Status: AC
  Administered 2011-07-31: 1000 mL via INTRAVENOUS

## 2011-07-31 MED ORDER — MIDAZOLAM HCL 5 MG/5ML IJ SOLN
INTRAMUSCULAR | Status: DC | PRN
  Administered 2011-07-31: 1 mg via INTRAVENOUS
  Administered 2011-07-31: 2 mg via INTRAVENOUS

## 2011-07-31 MED ORDER — MEPERIDINE HCL 50 MG/ML IJ SOLN
INTRAMUSCULAR | Status: AC
  Filled 2011-07-31: qty 1

## 2011-07-31 MED ORDER — SODIUM CHLORIDE 0.9 % IJ SOLN
INTRAMUSCULAR | Status: DC | PRN
  Administered 2011-07-31: 10 mL

## 2011-07-31 NOTE — H&P (Signed)
Ricky Wallace is an 78 y.o. male.   Chief Complaint: Patient is here for colonoscopy. HPI: Patient is a [age]-year-old Caucasian male with history of colon carcinoma who had surgery back in 1985. He has remained in remission. His last exam was about 3 years ago by Dr. Marcell Anger who has relocated. Patient denies abdominal pain change in his bowel habits or rectal bleeding. He has good appetite. He had renal transplant in August 2011 and has done well.  Past Medical History  Diagnosis Date  . Chronic kidney disease     renal failure from htn had transplant  . Hypertension   . GERD (gastroesophageal reflux disease)     Past Surgical History  Procedure Date  . Colon surgery     colon resection for cancer  . Kidney transplant   . Appendectomy   . Tonsillectomy   . Colonoscopies   . Av fistula left arm     History reviewed. No pertinent family history. Social History:  reports that he has never smoked. He does not have any smokeless tobacco history on file. He reports that he does not drink alcohol or use illicit drugs.  Allergies:  Allergies  Allergen Reactions  . Iodinated Diagnostic Agents Anaphylaxis    Medications Prior to Admission  Medication Sig Dispense Refill  . amLODipine (NORVASC) 10 MG tablet Take 10 mg by mouth every morning.      Marland Kitchen aspirin EC 81 MG tablet Take 81 mg by mouth every evening.      . folic acid (FOLVITE) 1 MG tablet Take 1 mg by mouth daily at 12 noon.      . metoprolol succinate (TOPROL-XL) 25 MG 24 hr tablet Take 12.5 mg by mouth at bedtime.      . mycophenolate (MYFORTIC) 180 MG EC tablet Take 180 mg by mouth 2 (two) times daily.      Marland Kitchen omeprazole (PRILOSEC) 20 MG capsule Take 20 mg by mouth every morning.      . peg 3350 powder (MOVIPREP) SOLR Take 1 kit (100 g total) by mouth once.  1 kit  0  . Prenatal Vit-Fe Fumarate-FA (PRENATAL MULTIVITAMIN) TABS Take 1 tablet by mouth daily.      Marland Kitchen sulfamethoxazole-trimethoprim (BACTRIM DS,SEPTRA DS) 800-160  MG per tablet Take 1 tablet by mouth See admin instructions. Mondays Wednesdays and Fridays      . tacrolimus (PROGRAF) 1 MG capsule Take 1 mg by mouth 2 (two) times daily.      . Tamsulosin HCl (FLOMAX) 0.4 MG CAPS Take 0.4 mg by mouth daily after breakfast.        No results found for this or any previous visit (from the past 48 hour(s)). No results found.  ROS  Blood pressure 140/65, pulse 75, temperature 97.6 F (36.4 C), temperature source Oral, resp. rate 20, height 5\' 7"  (1.702 m), weight 147 lb (66.679 kg). Physical Exam  Constitutional: He appears well-developed and well-nourished.  HENT:  Mouth/Throat: Oropharynx is clear and moist.  Eyes: Conjunctivae are normal. No scleral icterus.  Neck: No thyromegaly present.  Cardiovascular: Normal rate, regular rhythm and normal heart sounds.   No murmur heard. Respiratory: Effort normal and breath sounds normal.  GI: Soft. He exhibits no distension. There is no tenderness.       Long scar across lower abdomen and palpable kidney in right lower quadrant laterally  Musculoskeletal: He exhibits no edema.  Lymphadenopathy:    He has no cervical adenopathy.  Neurological: He is alert.  Skin: Skin is warm and dry.     Assessment/Plan History of colon cancer. Surveillance colonoscopy.  Karinda Cabriales U 07/31/2011, 8:35 AM

## 2011-07-31 NOTE — Discharge Instructions (Signed)
No aspirin or NSAIDs for 2 weeks. Resume other medications as before. No driving for 24 hours. Physician will contact you with biopsy results. Remember you cannot have MRI unless clip has passedColon Polyps A polyp is extra tissue that grows inside your body. Colon polyps grow in the large intestine. The large intestine, also called the colon, is part of your digestive system. It is a long, hollow tube at the end of your digestive tract where your body makes and stores stool. Most polyps are not dangerous. They are benign. This means they are not cancerous. But over time, some types of polyps can turn into cancer. Polyps that are smaller than a pea are usually not harmful. But larger polyps could someday become or may already be cancerous. To be safe, doctors remove all polyps and test them.  WHO GETS POLYPS? Anyone can get polyps, but certain people are more likely than others. You may have a greater chance of getting polyps if:  You are over 50.   You have had polyps before.   Someone in your family has had polyps.   Someone in your family has had cancer of the large intestine.   Find out if someone in your family has had polyps. You may also be more likely to get polyps if you:   Eat a lot of fatty foods.   Smoke.   Drink alcohol.   Do not exercise.   Eat too much.  SYMPTOMS  Most small polyps do not cause symptoms. People often do not know they have one until their caregiver finds it during a regular checkup or while testing them for something else. Some people do have symptoms like these:  Bleeding from the anus. You might notice blood on your underwear or on toilet paper after you have had a bowel movement.   Constipation or diarrhea that lasts more than a week.   Blood in the stool. Blood can make stool look black or it can show up as red streaks in the stool.  If you have any of these symptoms, see your caregiver. HOW DOES THE DOCTOR TEST FOR POLYPS? The doctor can use  four tests to check for polyps:  Digital rectal exam. The caregiver wears gloves and checks your rectum (the last part of the large intestine) to see if it feels normal. This test would find polyps only in the rectum. Your caregiver may need to do one of the other tests listed below to find polyps higher up in the intestine.   Barium enema. The caregiver puts a liquid called barium into your rectum before taking x-rays of your large intestine. Barium makes your intestine look white in the pictures. Polyps are dark, so they are easy to see.   Sigmoidoscopy. With this test, the caregiver can see inside your large intestine. A thin flexible tube is placed into your rectum. The device is called a sigmoidoscope, which has a light and a tiny video camera in it. The caregiver uses the sigmoidoscope to look at the last third of your large intestine.   Colonoscopy. This test is like sigmoidoscopy, but the caregiver looks at all of the large intestine. It usually requires sedation. This is the most common method for finding and removing polyps.  TREATMENT   The caregiver will remove the polyp during sigmoidoscopy or colonoscopy. The polyp is then tested for cancer.   If you have had polyps, your caregiver may want you to get tested regularly in the future.  PREVENTION  There is not one sure way to prevent polyps. You might be able to lower your risk of getting them if you:  Eat more fruits and vegetables and less fatty food.   Do not smoke.   Avoid alcohol.   Exercise every day.   Lose weight if you are overweight.   Eating more calcium and folate can also lower your risk of getting polyps. Some foods that are rich in calcium are milk, cheese, and broccoli. Some foods that are rich in folate are chickpeas, kidney beans, and spinach.   Aspirin might help prevent polyps. Studies are under way.  Document Released: 10/17/2003 Document Revised: 01/09/2011 Document Reviewed: 03/24/2007 [facility]  Patient Information 2012 [place], [place].Colonoscopy Care After Read the instructions outlined below and refer to this sheet in the next few weeks. These discharge instructions provide you with general information on caring for yourself after you leave the hospital. Your doctor may also give you specific instructions. While your treatment has been planned according to the most current medical practices available, unavoidable complications occasionally occur. If you have any problems or questions after discharge, call your doctor. HOME CARE INSTRUCTIONS ACTIVITY:  You may resume your regular activity, but move at a slower pace for the next 24 hours.   Take frequent rest periods for the next 24 hours.   Walking will help get rid of the air and reduce the bloated feeling in your belly (abdomen).   No driving for 24 hours (because of the medicine (anesthesia) used during the test).   You may shower.   Do not sign any important legal documents or operate any machinery for 24 hours (because of the anesthesia used during the test).  NUTRITION:  Drink plenty of fluids.   You may resume your normal diet as instructed by your doctor.   Begin with a light meal and progress to your normal diet. Heavy or fried foods are harder to digest and may make you feel sick to your stomach (nauseated).   Avoid alcoholic beverages for 24 hours or as instructed.  MEDICATIONS:  You may resume your normal medications unless your doctor tells you otherwise.  WHAT TO EXPECT TODAY:  Some feelings of bloating in the abdomen.   Passage of more gas than usual.   Spotting of blood in your stool or on the toilet paper.  IF YOU HAD POLYPS REMOVED DURING THE COLONOSCOPY:  No aspirin products for 7 days or as instructed.   No alcohol for 7 days or as instructed.   Eat a soft diet for the next 24 hours.  FINDING OUT THE RESULTS OF YOUR TEST Not all test results are available during your visit. If your test  results are not back during the visit, make an appointment with your caregiver to find out the results. Do not assume everything is normal if you have not heard from your caregiver or the medical facility. It is important for you to follow up on all of your test results.  SEEK IMMEDIATE MEDICAL CARE IF:  You have more than a spotting of blood in your stool.   Your belly is swollen (abdominal distention).   You are nauseated or vomiting.   You have a fever.   You have abdominal pain or discomfort that is severe or gets worse throughout the day.  Document Released: 09/04/2003 Document Revised: 01/09/2011 Document Reviewed: 09/02/2007 [facility] Patient Information 2012 [place], [place].

## 2011-07-31 NOTE — Op Note (Signed)
COLONOSCOPY PROCEDURE REPORT  PATIENT:  Ricky Wallace  MR#:  [id number] Birthdate:  [birth year], 78 y.o., male Endoscopist:  Dr. Malissa Hippo, MD Referred By:  Dub Mikes, MD Procedure Date: 07/31/2011  Procedure:   Colonoscopy with snare polypectomy.  Indications: Patient is 78 year old Caucasian male with history of colon carcinoma status post sigmoid colectomy 1985 who is here for surveillance colonoscopy.  Informed Consent:  The procedure and risks were reviewed with the patient and informed consent was obtained.  Medications:   Versed 3 mg IV  Description of procedure:  After a digital rectal exam was performed, that colonoscope was advanced from the anus through the rectum and colon to the area of the cecum, ileocecal valve and appendiceal orifice. The cecum was deeply intubated. These structures were well-seen and photographed for the record. From the level of the cecum and ileocecal valve, the scope was slowly and cautiously withdrawn. The mucosal surfaces were carefully surveyed utilizing scope tip to flexion to facilitate fold flattening as needed. The scope was pulled down into the rectum where a thorough exam including retroflexion was performed.  Findings:   Prep satisfactory. 2 small polyps ablated via cold biopsy from cecum and submitted together. Small polyp ablated via cold biopsy from hepatic flexure. 10 mm sessile polyp at splenic flexure with difficult approach. Saline-assisted polypectomy performed. Post polypectomy oozing controlled with application of single Hemoclip. 12 mm broad-based polyp snared from segment proximal to anastomosis following saline injection. 2 more polyps snared from the same area and submitted together. 4 small polyps were coagulated using snare tip in the left colon. Few scattered diverticula throughout the colon. Colonic anastomosis located at 20 cm from ano-rectal junction.  Therapeutic/Diagnostic Maneuvers Performed:  See  above  Complications:  None  Cecal Withdrawal Time:  56 minutes  Impression:  Examination performed to cecum. Few diverticula scattered throughout the colon. Chronic anastomosis at 20 cm from anal margin. Multiple polyps identified. Some of the small polyps are not removed. 2 cecal polyps ablated via cold biopsy. From hepatic flexure ablated via cold biopsy. Sessile polyp at splenic flexure. Saline-assisted polypectomy performed. Oozing from polypectomy site controlled with application of one Hemoclip. 3 polyps snared from sigmoid colon. One was elevated with saline injection and snared. 2 others were submitted together.  Recommendations:  Standard instructions given. I will contact patient with biopsy results and plan to bring him back for next colonoscopy in one year.  Ricky Wallace U  07/31/2011 9:51 AM  CC: Dr. Ignatius Specking., MD & Dr. Bonnetta Barry ref. provider found

## 2011-08-04 ENCOUNTER — Encounter (INDEPENDENT_AMBULATORY_CARE_PROVIDER_SITE_OTHER): Payer: Self-pay | Admitting: Internal Medicine

## 2011-08-04 ENCOUNTER — Ambulatory Visit (INDEPENDENT_AMBULATORY_CARE_PROVIDER_SITE_OTHER): Payer: Medicare Other | Admitting: Internal Medicine

## 2011-08-04 VITALS — BP 140/80 | HR 72 | Temp 98.6°F | Resp 20 | Ht 67.0 in | Wt 150.9 lb

## 2011-08-04 DIAGNOSIS — Z85038 Personal history of other malignant neoplasm of large intestine: Secondary | ICD-10-CM | POA: Insufficient documentation

## 2011-08-04 DIAGNOSIS — Z940 Kidney transplant status: Secondary | ICD-10-CM | POA: Insufficient documentation

## 2011-08-04 DIAGNOSIS — Z86010 Personal history of colonic polyps: Secondary | ICD-10-CM

## 2011-08-04 DIAGNOSIS — R509 Fever, unspecified: Secondary | ICD-10-CM

## 2011-08-04 NOTE — Progress Notes (Signed)
Presenting complaint;  Fever. Patient is status post colonoscopy with polypectomy on 07/31/2011. Subjective:   Patient is a [age]-year-old Caucasian male who underwent surveillance colonoscopy on 07/31/2011 with removal of the multiple adenomas. He had low-grade fever for 3 days. Yesterday his temp was 102 he took Tylenol and it returned to normal. He was therefore advised to come for office visit. He has not had any fever today. Since he has had renal transplant his doctors at  [facility] have told him to take his temperature every day. He denies sore throat cough shortness of breath chest pain abdominal pain diarrhea or rectal bleeding. He also denies dysuria. He has a good appetite he felt weak over the weekend but not anymore. He is due for his routine blood work in one week from now since he is on immunosuppressives.  Current Medications: Current Outpatient Prescriptions  Medication Sig Dispense Refill  . amLODipine (NORVASC) 10 MG tablet Take 10 mg by mouth every morning.      . folic acid (FOLVITE) 1 MG tablet Take 1 mg by mouth daily at 12 noon.      . metoprolol succinate (TOPROL-XL) 25 MG 24 hr tablet Take 12.5 mg by mouth at bedtime.      . mycophenolate (MYFORTIC) 180 MG EC tablet Take 180 mg by mouth 2 (two) times daily.      Marland Kitchen omeprazole (PRILOSEC) 20 MG capsule Take 20 mg by mouth every morning.      . Prenatal Vit-Fe Fumarate-FA (PRENATAL MULTIVITAMIN) TABS Take 1 tablet by mouth daily.      Marland Kitchen sulfamethoxazole-trimethoprim (BACTRIM DS,SEPTRA DS) 800-160 MG per tablet Take 1 tablet by mouth See admin instructions. Mondays Wednesdays and Fridays      . tacrolimus (PROGRAF) 1 MG capsule Take 1 mg by mouth 2 (two) times daily.      . Tamsulosin HCl (FLOMAX) 0.4 MG CAPS Take 0.4 mg by mouth daily after breakfast.         Objective: Blood pressure 140/80, pulse 72, temperature 98.6 F (37 C), temperature source Oral, resp. rate 20, height 5\' 7"  (1.702 m), weight 150 lb 14.4 oz (68.448  kg). Patient is alert and in no acute disc Conjunctiva is pink. Sclera is nonicteric Oropharyngeal mucosa is normal. No neck masses or thyromegaly noted. Cardiac exam with regular rhythm normal S1 and S2. No murmur or gallop noted. Lungs are clear to auscultation. Abdomen. Bowel sounds are normal. Abdomen is soft and nontender with palpable kidney in right lower quadrant. No LE edema or clubbing noted.   Assessment:  #1. Isolated symptom of fever since colonoscopy with polypectomy. He has no abdominal pain or diarrhea or for that matter any other constitutional symptoms. Fever may or may not be related to colonoscopy. He is afebrile and has no symptoms therefore would not pursue with further workup. #2. History of colon carcinoma. He had multiple adenomas removed. He may still have small polyps left behind.   Plan:  Go to emergency room if temp greater than 101. Resume aspirin in one week. Next colonoscopy in one year.

## 2011-08-04 NOTE — Patient Instructions (Signed)
Can resume aspirin next week Monday. Notify if temp greater than 101 F.

## 2011-08-08 ENCOUNTER — Telehealth (INDEPENDENT_AMBULATORY_CARE_PROVIDER_SITE_OTHER): Payer: Self-pay | Admitting: *Deleted

## 2011-08-08 NOTE — Telephone Encounter (Signed)
Per.Dr.Rehman he will review lab work and call patient later today. Feels that patient's symptoms are not related to TCS but may be to medications. Mrs.Arlotta called and made aware.

## 2011-08-08 NOTE — Telephone Encounter (Signed)
I doubt patient's low-grade fever has anything to do with colonoscopy which was over 8 days ago. I will call patient and blood work is available.

## 2011-08-08 NOTE — Telephone Encounter (Signed)
Ricky Wallace called office and states that she had called Dr.Rehman at home and left message. She states that [name] continues to run a low grade fever at night,feels completely worn out , and has a little hacking cough. [name] did have lab work and she would like to have the results of them. Dr.Rehman made aware.

## 2011-08-14 ENCOUNTER — Encounter (INDEPENDENT_AMBULATORY_CARE_PROVIDER_SITE_OTHER): Payer: Self-pay | Admitting: *Deleted

## 2011-08-28 ENCOUNTER — Encounter (INDEPENDENT_AMBULATORY_CARE_PROVIDER_SITE_OTHER): Payer: Self-pay

## 2012-07-27 ENCOUNTER — Encounter (INDEPENDENT_AMBULATORY_CARE_PROVIDER_SITE_OTHER): Payer: Self-pay | Admitting: *Deleted

## 2012-08-05 ENCOUNTER — Other Ambulatory Visit (INDEPENDENT_AMBULATORY_CARE_PROVIDER_SITE_OTHER): Payer: Self-pay | Admitting: *Deleted

## 2012-08-05 ENCOUNTER — Telehealth (INDEPENDENT_AMBULATORY_CARE_PROVIDER_SITE_OTHER): Payer: Self-pay | Admitting: *Deleted

## 2012-08-05 DIAGNOSIS — Z800 Family history of malignant neoplasm of digestive organs: Secondary | ICD-10-CM

## 2012-08-05 DIAGNOSIS — Z85038 Personal history of other malignant neoplasm of large intestine: Secondary | ICD-10-CM

## 2012-08-05 DIAGNOSIS — Z86010 Personal history of colonic polyps: Secondary | ICD-10-CM

## 2012-08-05 DIAGNOSIS — Z1211 Encounter for screening for malignant neoplasm of colon: Secondary | ICD-10-CM

## 2012-08-05 NOTE — Telephone Encounter (Signed)
Patient needs movi prep 

## 2012-08-10 MED ORDER — PEG-KCL-NACL-NASULF-NA ASC-C 100 G PO SOLR: 1.0000 | Freq: Once | ORAL | Status: DC

## 2012-08-19 ENCOUNTER — Encounter (INDEPENDENT_AMBULATORY_CARE_PROVIDER_SITE_OTHER): Payer: Self-pay | Admitting: *Deleted

## 2012-09-02 ENCOUNTER — Telehealth (INDEPENDENT_AMBULATORY_CARE_PROVIDER_SITE_OTHER): Payer: Self-pay | Admitting: *Deleted

## 2012-09-02 NOTE — Telephone Encounter (Signed)
  Procedure: tcs  Reason/Indication:  Hx polyps, hx colon ca, fam hx colon ca  Has patient had this procedure before?  Yes, 2013 (EPIC)  If so, when, by whom and where?    Is there a family history of colon cancer?  Yes, brother  Who?  What age when diagnosed?    Is patient diabetic?   no      Does patient have prosthetic heart valve?  no  Do you have a pacemaker?  no  Has patient ever had endocarditis? no  Has patient had joint replacement within last 12 months?  no  Is patient on Coumadin, Plavix and/or Aspirin? yes  Medications: see EPIC  Allergies: see EPIC  Medication Adjustment: asa 2 days  Procedure date & time: 09/23/12 at 930

## 2012-09-03 NOTE — Telephone Encounter (Signed)
agree

## 2012-09-06 ENCOUNTER — Encounter (HOSPITAL_COMMUNITY): Payer: Self-pay | Admitting: Pharmacy Technician

## 2012-09-23 ENCOUNTER — Encounter (HOSPITAL_COMMUNITY): Payer: Self-pay | Admitting: *Deleted

## 2012-09-23 ENCOUNTER — Ambulatory Visit (HOSPITAL_COMMUNITY)
Admission: RE | Admit: 2012-09-23 | Discharge: 2012-09-23 | Disposition: A | Discharge status: Home / Self Care | Payer: Medicare Other | Source: Ambulatory Visit | Attending: Internal Medicine | Admitting: Internal Medicine

## 2012-09-23 ENCOUNTER — Encounter (HOSPITAL_COMMUNITY)
Admission: RE | Disposition: A | Discharge status: Home / Self Care | Payer: Self-pay | Source: Ambulatory Visit | Attending: Internal Medicine

## 2012-09-23 DIAGNOSIS — I120 Hypertensive chronic kidney disease with stage 5 chronic kidney disease or end stage renal disease: Secondary | ICD-10-CM | POA: Insufficient documentation

## 2012-09-23 DIAGNOSIS — D126 Benign neoplasm of colon, unspecified: Secondary | ICD-10-CM

## 2012-09-23 DIAGNOSIS — Z800 Family history of malignant neoplasm of digestive organs: Secondary | ICD-10-CM

## 2012-09-23 DIAGNOSIS — N186 End stage renal disease: Secondary | ICD-10-CM | POA: Insufficient documentation

## 2012-09-23 DIAGNOSIS — K219 Gastro-esophageal reflux disease without esophagitis: Secondary | ICD-10-CM | POA: Insufficient documentation

## 2012-09-23 DIAGNOSIS — Z940 Kidney transplant status: Secondary | ICD-10-CM | POA: Insufficient documentation

## 2012-09-23 DIAGNOSIS — I10 Essential (primary) hypertension: Secondary | ICD-10-CM | POA: Insufficient documentation

## 2012-09-23 DIAGNOSIS — Z85038 Personal history of other malignant neoplasm of large intestine: Secondary | ICD-10-CM

## 2012-09-23 DIAGNOSIS — Z860100 Personal history of colon polyps, unspecified: Secondary | ICD-10-CM

## 2012-09-23 DIAGNOSIS — Z79899 Other long term (current) drug therapy: Secondary | ICD-10-CM | POA: Insufficient documentation

## 2012-09-23 DIAGNOSIS — K5730 Diverticulosis of large intestine without perforation or abscess without bleeding: Secondary | ICD-10-CM

## 2012-09-23 DIAGNOSIS — Z91041 Radiographic dye allergy status: Secondary | ICD-10-CM | POA: Insufficient documentation

## 2012-09-23 DIAGNOSIS — Z1211 Encounter for screening for malignant neoplasm of colon: Secondary | ICD-10-CM | POA: Insufficient documentation

## 2012-09-23 HISTORY — PX: COLONOSCOPY: SHX5424

## 2012-09-23 SURGERY — COLONOSCOPY
Anesthesia: Moderate Sedation

## 2012-09-23 MED ORDER — MIDAZOLAM HCL 5 MG/5ML IJ SOLN
INTRAMUSCULAR | Status: AC
  Filled 2012-09-23: qty 10

## 2012-09-23 MED ORDER — SODIUM CHLORIDE 0.9 % IV SOLN
INTRAVENOUS | Status: DC
  Administered 2012-09-23: 1000 mL via INTRAVENOUS

## 2012-09-23 MED ORDER — MIDAZOLAM HCL 5 MG/5ML IJ SOLN
INTRAMUSCULAR | Status: DC | PRN
  Administered 2012-09-23: 2 mg via INTRAVENOUS
  Administered 2012-09-23: 1 mg via INTRAVENOUS

## 2012-09-23 MED ORDER — MEPERIDINE HCL 50 MG/ML IJ SOLN
INTRAMUSCULAR | Status: AC
  Filled 2012-09-23: qty 1

## 2012-09-23 MED ORDER — STERILE WATER FOR IRRIGATION IR SOLN
Status: DC | PRN
  Administered 2012-09-23: 11:00:00

## 2012-09-23 NOTE — Op Note (Addendum)
COLONOSCOPY PROCEDURE REPORT  PATIENT:  Ricky Wallace  MR#:  [id number] Birthdate:  08/19/1922, 79 y.o., male Endoscopist:  Dr. Malissa Hippo, MD Referred By:  Dr. Ignatius Specking, MD Procedure Date: 09/23/2012  Procedure:   Colonoscopy with snare polypectomy.  Indications:  Patient is an 79 year-old Caucasian male with history of colon carcinoma and history of multiple colonic adenomas. His last exam was in June 2013 with removal of close to dozen polyps. He still had few polyps left. He is therefore returning for this procedure.  Informed Consent:  The procedure and risks were reviewed with the patient and informed consent was obtained.  Medications:  Versed 3 mg IV  Description of procedure:  After a digital rectal exam was performed, that colonoscope was advanced from the anus through the rectum and colon to the area of the cecum, ileocecal valve and appendiceal orifice. The cecum was deeply intubated. These structures were well-seen and photographed for the record. From the level of the cecum and ileocecal valve, the scope was slowly and cautiously withdrawn. The mucosal surfaces were carefully surveyed utilizing scope tip to flexion to facilitate fold flattening as needed. The scope was pulled down into the rectum where a thorough exam including retroflexion was performed.  Findings:  Prep satisfactory. Few scattered diverticula throughout the colon. 2 small polyps were ablated via cold biopsy and submitted together(ascending and descending colon). 3 polyps were hot snared and submitted together(these were 6-7 mm in diameter). 2 are located at splenic flexure and the third one is descending colon. Another small polyp at splenic flexure was coagulated using snare tip. Colonic anastomosis at 20 cm from the anal margin.   Therapeutic/Diagnostic Maneuvers Performed:  See above  Complications:  None  Cecal Withdrawal Time:  19 minutes  Impression:  Examination performed to  cecum. Scattered diverticula throughout the colon. Wide-open colonic anastomosis at 20 cm from the anal margin. Three small polyps were hot snared and submitted together(two at splenic flexure and one and descending). Two small polyps were ablated via cold biopsy and submitted together(ascending and descending colon). Sixth small polyp at splenic flexure was coagulated.     Recommendations:  Standard instructions given. I will contact patient with biopsy results and further recommendations.  REHMAN,NAJEEB U  09/23/2012 11:23 AM  CC: Dr. Ignatius Specking., MD & Dr. Bonnetta Barry ref. provider found

## 2012-09-23 NOTE — H&P (Signed)
Ricky Wallace is an 79 y.o. male.   Chief Complaint: Patient is here for colonoscopy. HPI: Patient is a 79 year old Caucasian male with history of colon carcinoma with underwent sigmoid colon resection 1985. His last colonoscopy was June 2013 with removal of multiple adenomas. He may still have a few more polyps left. He denies abdominal pain change in his bowel habits or rectal bleeding.  Past Medical History  Diagnosis Date  . Chronic kidney disease     renal failure from htn had transplant  . Hypertension   . GERD (gastroesophageal reflux disease)     Past Surgical History  Procedure Laterality Date  . Colon surgery      colon resection for cancer  . Kidney transplant    . Appendectomy    . Tonsillectomy    . Colonoscopies    . Av fistula left arm    . Colonoscopy    . Colonoscopy  07/31/2011    Procedure: COLONOSCOPY;  Surgeon: Malissa Hippo, MD;  Location: [facility];  Service: Endoscopy;  Laterality: N/A;  830    History reviewed. No pertinent family history. Social History:  reports that he has never smoked. He has never used smokeless tobacco. He reports that he does not drink alcohol or use illicit drugs.  Allergies:  Allergies  Allergen Reactions  . Iodinated Diagnostic Agents Anaphylaxis    Medications Prior to Admission  Medication Sig Dispense Refill  . amLODipine (NORVASC) 10 MG tablet Take 10 mg by mouth every morning.      . folic acid (FOLVITE) 1 MG tablet Take 1 mg by mouth daily at 12 noon.      . metoprolol succinate (TOPROL-XL) 25 MG 24 hr tablet Take 12.5 mg by mouth at bedtime.      . mycophenolate (MYFORTIC) 180 MG EC tablet Take 180 mg by mouth 2 (two) times daily.      Marland Kitchen omeprazole (PRILOSEC) 20 MG capsule Take 20 mg by mouth every morning.      . Prenatal Vit-Fe Fumarate-FA (PRENATAL MULTIVITAMIN) TABS Take 1 tablet by mouth daily.      Marland Kitchen sulfamethoxazole-trimethoprim (BACTRIM DS,SEPTRA DS) 800-160 MG per tablet Take 1 tablet by mouth See  admin instructions. Mondays Wednesdays and Fridays      . tacrolimus (PROGRAF) 1 MG capsule Take 1 mg by mouth 2 (two) times daily.      . Tamsulosin HCl (FLOMAX) 0.4 MG CAPS Take 0.4 mg by mouth daily after breakfast.        No results found for this or any previous visit (from the past 48 hour(s)). No results found.  ROS  Blood pressure 167/75, pulse 72, temperature 97.7 F (36.5 C), temperature source Oral, resp. rate 16, height 5\' 7"  (1.702 m), weight 150 lb (68.04 kg), SpO2 98.00%. Physical Exam  Constitutional: He appears well-developed and well-nourished.  HENT:  Mouth/Throat: Oropharynx is clear and moist.  Eyes: Conjunctivae are normal. No scleral icterus.  Neck: No thyromegaly present.  Cardiovascular: Normal rate, regular rhythm and normal heart sounds.   No murmur heard. Respiratory: Effort normal and breath sounds normal.  GI: Soft. He exhibits no distension and no mass. There is no tenderness.  Musculoskeletal: He exhibits no edema.  Lymphadenopathy:    He has no cervical adenopathy.  Neurological: He is alert.  Skin: Skin is warm and dry.     Assessment/Plan History of colon carcinoma and multiple colonic adenomas. Surveillance colonoscopy.  Arvie Villarruel U 09/23/2012, 10:41 AM

## 2012-09-24 ENCOUNTER — Encounter (HOSPITAL_COMMUNITY): Payer: Self-pay | Admitting: Internal Medicine

## 2012-10-06 ENCOUNTER — Encounter (INDEPENDENT_AMBULATORY_CARE_PROVIDER_SITE_OTHER): Payer: Self-pay | Admitting: *Deleted

## 2015-09-11 ENCOUNTER — Encounter (INDEPENDENT_AMBULATORY_CARE_PROVIDER_SITE_OTHER): Payer: Self-pay | Admitting: Internal Medicine

## 2015-12-04 ENCOUNTER — Encounter (INDEPENDENT_AMBULATORY_CARE_PROVIDER_SITE_OTHER): Payer: Self-pay | Admitting: Internal Medicine

## 2015-12-04 ENCOUNTER — Encounter (INDEPENDENT_AMBULATORY_CARE_PROVIDER_SITE_OTHER): Payer: Self-pay

## 2015-12-04 ENCOUNTER — Ambulatory Visit (INDEPENDENT_AMBULATORY_CARE_PROVIDER_SITE_OTHER): Payer: Medicare Other | Admitting: Internal Medicine

## 2015-12-04 VITALS — BP 140/62 | HR 78 | Temp 98.6°F | Resp 18 | Ht 67.0 in | Wt 152.9 lb

## 2015-12-04 DIAGNOSIS — I208 Other forms of angina pectoris: Secondary | ICD-10-CM | POA: Diagnosis not present

## 2015-12-04 DIAGNOSIS — I209 Angina pectoris, unspecified: Secondary | ICD-10-CM | POA: Diagnosis not present

## 2015-12-04 DIAGNOSIS — R195 Other fecal abnormalities: Secondary | ICD-10-CM

## 2015-12-04 MED ORDER — NITROGLYCERIN 0.4 MG/SPRAY TL SOLN: 1.0000 | Status: AC | PRN

## 2015-12-04 NOTE — Progress Notes (Signed)
Presenting complaint;  Heme positive stool.  Subjective:  Patient is 82 year old Caucasian male who recently had heme positive stool by Dr. Woody Seller is therefore here for further evaluation. He denies abdominal pain melena or rectal bleeding. He has good appetite and has not lost any weight. He has history of colonic carcinoma and colonic polyps. His last exam was in August 2014 with removal of 5 small polyps. He did have H&H and was normal. Patient's wife states that she has noted him to become short of breath with minimal exertion and also has complained of chest tightness. These symptoms started 1 week ago. He feels better within a few minutes of resting. He denies diaphoreses or lightheadedness. He is on amoxicillin for dental problems and he has another week's worth of medication left.  Current Medications: Outpatient Encounter Prescriptions as of 12/04/2015  Medication Sig  . amLODipine (NORVASC) 10 MG tablet Take 10 mg by mouth every morning.  Marland Kitchen amoxicillin (AMOXIL) 875 MG tablet Take by mouth 2 (two) times daily. For 10 days.  Marland Kitchen aspirin EC 81 MG tablet Take 81 mg by mouth daily.  . folic acid (FOLVITE) 1 MG tablet Take 800 mcg by mouth daily at 12 noon.   . furosemide (LASIX) 20 MG tablet Take 20 mg by mouth. Patient is taking 1 1/2 daily.  . metoprolol succinate (TOPROL-XL) 25 MG 24 hr tablet Take 25 mg by mouth daily.   . mycophenolate (MYFORTIC) 180 MG EC tablet Take 180 mg by mouth 2 (two) times daily.  Marland Kitchen omeprazole (PRILOSEC) 20 MG capsule Take 20 mg by mouth every morning.  . Prenatal Vit-Fe Fumarate-FA (PRENATAL MULTIVITAMIN) TABS Take 1 tablet by mouth daily.  Marland Kitchen sulfamethoxazole-trimethoprim (BACTRIM DS,SEPTRA DS) 800-160 MG per tablet Take 1 tablet by mouth See admin instructions. Mondays Wednesdays and Fridays  . tacrolimus (PROGRAF) 1 MG capsule Take 1 mg by mouth 2 (two) times daily.  . Tamsulosin HCl (FLOMAX) 0.4 MG CAPS Take 0.4 mg by mouth daily after breakfast.   No  facility-administered encounter medications on file as of 12/04/2015.    Past Medical History:  Diagnosis Date  . Chronic kidney disease    renal failure from htn had transplant  . GERD (gastroesophageal reflux disease)   . Hypertension    Past Surgical History:  Procedure Laterality Date  . APPENDECTOMY    . av fistula left arm    . COLON SURGERY     colon resection for cancer  . colonoscopies    . COLONOSCOPY    . COLONOSCOPY  07/31/2011   Procedure: COLONOSCOPY;  Surgeon: Rogene Houston, MD;  Location: [facility];  Service: Endoscopy;  Laterality: N/A;  830  . COLONOSCOPY N/A 09/23/2012   Procedure: COLONOSCOPY;  Surgeon: Rogene Houston, MD;  Location: [facility];  Service: Endoscopy;  Laterality: N/A;  930  . KIDNEY TRANSPLANT    . TONSILLECTOMY        Objective: Blood pressure 140/62, pulse 78, temperature 98.6 F (37 C), temperature source Oral, resp. rate 18, height 5\' 7"  (1.702 m), weight 152 lb 14.4 oz (69.4 kg). Patient is alert and in no acute distress. Conjunctiva is pink. Sclera is nonicteric Oropharyngeal mucosa is normal. No neck masses or thyromegaly noted. Cardiac exam with regular rhythm normal S1 and S2. No murmur or gallop noted. Lungs are clear to auscultation. Abdomen is soft and nontender without organomegaly or masses. Rectal examination reveals soft brown stool and it is guaiac negative.  No LE  edema or clubbing noted.  Labs/studies Results: Lab data from 09/06/2015  WBC 4.9, H&H 14.4 and 42.7. MCV 93 and platelet count 223K.    Assessment:  #1. Heme positive stool was noted on routine exam. No history of melena or rectal bleeding. He has no alarm symptoms. H&H about 3 months ago was normal. Last colonoscopy was in August 2014. I would not recommend further evaluation unless his H&H is dropping. Heme positive stool may be due to aspirin use or he could have GI angiodysplasia. #2. Angina. Patient has typical symptoms.   Plan:  Dr.  Woody Seller was contacted and he will see patient tomorrow. Patient will have H&H with his next blood draw. Nitroglycerin spray to be used as directed. Patient advised to refrain from physical activity. If he has chest pain not relieved with nitroglycerin spray he should immediately report to emergency room. Office visit on as-needed basis.

## 2015-12-04 NOTE — Patient Instructions (Signed)
Nitroglycerin as directed. Follow-up with Dr. Woody Seller to be arranged. Physiciant will call with results of blood test when completed.

## 2015-12-14 ENCOUNTER — Encounter (INDEPENDENT_AMBULATORY_CARE_PROVIDER_SITE_OTHER): Payer: Self-pay

## 2016-01-04 DEATH — deceased
